# Patient Record
Sex: Female | Born: 1960
Health system: Southern US, Community
[De-identification: ages and names within clinical notes are randomized; demographics above are authoritative.]

## PROBLEM LIST (undated history)

## (undated) DIAGNOSIS — M8580 Other specified disorders of bone density and structure, unspecified site: Secondary | ICD-10-CM

## (undated) DIAGNOSIS — T7840XA Allergy, unspecified, initial encounter: Secondary | ICD-10-CM

## (undated) DIAGNOSIS — K219 Gastro-esophageal reflux disease without esophagitis: Secondary | ICD-10-CM

## (undated) HISTORY — PX: ERCP: SHX60

## (undated) HISTORY — PX: UPPER GASTROINTESTINAL ENDOSCOPY: SHX188

## (undated) HISTORY — DX: Gastro-esophageal reflux disease without esophagitis: K21.9

## (undated) HISTORY — PX: CHOLECYSTECTOMY: SHX55

## (undated) HISTORY — DX: Other specified disorders of bone density and structure, unspecified site: M85.80

## (undated) HISTORY — DX: Allergy, unspecified, initial encounter: T78.40XA

---

## 1998-06-10 ENCOUNTER — Ambulatory Visit (HOSPITAL_COMMUNITY): Admission: AD | Admit: 1998-06-10 | Discharge: 1998-06-10 | Payer: Self-pay | Admitting: *Deleted

## 1998-08-21 ENCOUNTER — Inpatient Hospital Stay (HOSPITAL_COMMUNITY): Admission: AD | Admit: 1998-08-21 | Discharge: 1998-08-26 | Payer: Self-pay | Admitting: Obstetrics and Gynecology

## 1998-10-14 ENCOUNTER — Observation Stay (HOSPITAL_COMMUNITY): Admission: RE | Admit: 1998-10-14 | Discharge: 1998-10-14 | Payer: Self-pay | Admitting: *Deleted

## 2002-07-26 ENCOUNTER — Other Ambulatory Visit: Admission: RE | Admit: 2002-07-26 | Discharge: 2002-07-26 | Payer: Self-pay | Admitting: Obstetrics and Gynecology

## 2004-08-24 ENCOUNTER — Ambulatory Visit: Payer: Self-pay | Admitting: Gastroenterology

## 2004-09-02 ENCOUNTER — Ambulatory Visit: Payer: Self-pay | Admitting: Gastroenterology

## 2004-09-24 ENCOUNTER — Emergency Department (HOSPITAL_COMMUNITY): Admission: AC | Admit: 2004-09-24 | Discharge: 2004-09-24 | Payer: Self-pay

## 2004-10-16 ENCOUNTER — Ambulatory Visit: Payer: Self-pay | Admitting: Gastroenterology

## 2004-10-22 ENCOUNTER — Ambulatory Visit: Payer: Self-pay | Admitting: Gastroenterology

## 2004-11-04 ENCOUNTER — Ambulatory Visit: Payer: Self-pay | Admitting: Gastroenterology

## 2005-06-11 ENCOUNTER — Other Ambulatory Visit: Admission: RE | Admit: 2005-06-11 | Discharge: 2005-06-11 | Payer: Self-pay | Admitting: Obstetrics and Gynecology

## 2006-06-08 ENCOUNTER — Ambulatory Visit: Payer: Self-pay | Admitting: Gastroenterology

## 2006-06-21 ENCOUNTER — Ambulatory Visit (HOSPITAL_COMMUNITY): Admission: RE | Admit: 2006-06-21 | Discharge: 2006-06-21 | Payer: Self-pay | Admitting: Gastroenterology

## 2006-06-29 ENCOUNTER — Ambulatory Visit: Payer: Self-pay | Admitting: Gastroenterology

## 2007-03-10 ENCOUNTER — Emergency Department (HOSPITAL_COMMUNITY): Admission: EM | Admit: 2007-03-10 | Discharge: 2007-03-10 | Payer: Self-pay | Admitting: Emergency Medicine

## 2007-03-11 ENCOUNTER — Ambulatory Visit (HOSPITAL_COMMUNITY): Admission: RE | Admit: 2007-03-11 | Discharge: 2007-03-11 | Payer: Self-pay | Admitting: Emergency Medicine

## 2009-04-03 ENCOUNTER — Telehealth: Payer: Self-pay | Admitting: Gastroenterology

## 2010-10-25 ENCOUNTER — Encounter: Payer: Self-pay | Admitting: Gastroenterology

## 2011-02-19 NOTE — Assessment & Plan Note (Signed)
[facility]                           GASTROENTEROLOGY OFFICE NOTE   NAME:Mcbee, SHIREE ALTEMUS                       MRN:          [id number]  DATE:06/08/2006                            DOB:          Mar 14, 1961    PROBLEM:  Abdominal pain.   Mrs. Goldner returned because of recurrent abdominal pain.  Last evening, she  developed severe unrelenting mid epigastric pain with some radiation to the  back.  She required narcotics for relief.  She was unable to find a  comfortable position.  Pain is similar to what she has had in the past.  In  the past, she has bumped her liver tests transiently.  Since last visit from  February 2006, she has had minor left upper quadrant discomfort which is  responsive to NuLev.  This pain clearly is different.  She is without fever.  She currently is fatigued but now pain-free.   MEDICATIONS:  1. Zoloft.  2. Clonazepam.   PHYSICAL EXAMINATION:  VITAL SIGNS:  Pulse 64, blood pressure 98/60, weight  143.  HEENT: EOMI. PERRLA. Sclerae are anicteric.  Conjunctivae are pink.  NECK:  Supple without thyromegaly, adenopathy or carotid bruits.  CHEST:  Clear to auscultation and percussion without adventitious sounds.  CARDIAC:  Regular rhythm; normal S1 S2.  There are no murmurs, gallops or  rubs.  ABDOMEN:  Bowel sounds are normoactive.  Abdomen is soft, non-tender and non-  distended.  There are no abdominal masses, tenderness, splenic enlargement  or hepatomegaly.  EXTREMITIES:  Full range of motion.  No cyanosis, clubbing or edema.  RECTAL:  Deferred.   IMPRESSION:  Recurrent, severe upper abdominal pain.  I am strongly  suspicious this is biliary tract pain, secondary to either  choledocholithiasis or perhaps biliary dyskinesia.   RECOMMENDATION:  1. Repeat LFTs.  2. ERCP.   I discussed the merits and risks for endoscopy sphincterotomy, particularly  in patients who may have biliary dyskinesia.  Risks for pancreatitis  with  occasional severe necrotizing pancreatitis  was discussed.  The patient is aware of this and wishes to proceed with the  procedure with possible sphincterotomy for biliary spasm if no stones are  seen.                                   Barbette Hair. Arlyce Dice, MD,FACG   RDK/MedQ  DD:  06/08/2006  DT:  06/08/2006  Job #:  [id number]   cc:   Leonette Most Record

## 2011-02-19 NOTE — Assessment & Plan Note (Signed)
[facility]                           GASTROENTEROLOGY OFFICE NOTE   NAME:Jasmine Wells, Jasmine Wells                         MRN:          [id number]  DATE:06/07/2006                            DOB:          1961/08/01    Her number is [phone].  [name] is a 50 year old patient of Dr. Arlyce Dice  who called tonight with severe upper abdominal pain under the right ribcage  radiating to the back that started about 30 minutes ago.  She was told by  Dr. Arlyce Dice to call if this pain occurs.  Apparently, there was a similar  episode approximately 2 years ago which raised the question of a common bile  duct stone.  She had a cholecystectomy in 2000.  She has not had any  episodes in the last two years until tonight.  She has no fever or jaundice.  She is in a lot of pain.   1. I advised patient to have a hepatitic function, amylase, lipase, in the      morning.  2. I called in to [facility], Vicodin generic dispensed 10, 1 p.o. every      3-4 hours p.r.n. abdominal pain.  3. Clear liquids tonight.  4. If the pain continues, call me and I will see her in the emergency      room.  5. I would leave a message for Dr. Marzetta Board nurse in the morning to call      the patient to set up appointment.                                   Hedwig Morton. Juanda Chance, MD   DMB/MedQ  DD:  06/07/2006  DT:  06/08/2006  Job #:  [id number]   cc:   Barbette Hair. Arlyce Dice, MD,FACG

## 2011-07-22 LAB — POCT CARDIAC MARKERS
CKMB, poc: <1 — ABNORMAL LOW
CKMB, poc: <1 — ABNORMAL LOW
Myoglobin, poc: 40.8
Myoglobin, poc: 46.8
Operator id: 192351
Operator id: 192351
Troponin i, poc: <0.05
Troponin i, poc: <0.05

## 2011-07-22 LAB — COMPREHENSIVE METABOLIC PANEL
ALT: 16
AST: 22
Albumin: 3.8
Alkaline Phosphatase: 56
BUN: 9
CO2: 24
Calcium: 9.5
Chloride: 102
Creatinine, Ser: 0.71
GFR calc Af Amer: >60
GFR calc non Af Amer: >60
Glucose, Bld: 97
Potassium: 4
Sodium: 137
Total Bilirubin: 0.5
Total Protein: 6.3

## 2011-07-22 LAB — DIFFERENTIAL
Basophils Absolute: 0
Basophils Relative: 1
Eosinophils Absolute: 0.1
Eosinophils Relative: 2
Lymphocytes Relative: 33
Lymphs Abs: 2.4
Monocytes Absolute: 0.5
Monocytes Relative: 6
Neutro Abs: 4.3
Neutrophils Relative %: 59

## 2011-07-22 LAB — CBC
HCT: 37.8
Hemoglobin: 12.7
MCHC: 33.7
MCV: 91.7
Platelets: 289
RBC: 4.12
RDW: 13.1
WBC: 7.4

## 2011-07-22 LAB — LIPASE, BLOOD: Lipase: 29

## 2011-07-22 LAB — D-DIMER, QUANTITATIVE: D-Dimer, Quant: <0.22

## 2012-05-05 ENCOUNTER — Encounter: Payer: Self-pay | Admitting: Gastroenterology

## 2012-05-05 DIAGNOSIS — J309 Allergic rhinitis, unspecified: Secondary | ICD-10-CM

## 2012-05-05 HISTORY — DX: Allergic rhinitis, unspecified: J30.9

## 2012-06-08 ENCOUNTER — Ambulatory Visit (AMBULATORY_SURGERY_CENTER): Payer: 59 | Admitting: *Deleted

## 2012-06-08 VITALS — Ht 64.0 in | Wt 154.1 lb

## 2012-06-08 DIAGNOSIS — Z1211 Encounter for screening for malignant neoplasm of colon: Secondary | ICD-10-CM

## 2012-06-08 MED ORDER — NA SULFATE-K SULFATE-MG SULF 17.5-3.13-1.6 GM/177ML PO SOLN: 1.0000 | Freq: Once | ORAL | Status: DC

## 2012-06-08 NOTE — Progress Notes (Signed)
No allergies to eggs or soy products 

## 2012-06-12 ENCOUNTER — Encounter: Payer: Self-pay | Admitting: Gastroenterology

## 2012-06-22 ENCOUNTER — Telehealth: Payer: Self-pay

## 2012-06-22 ENCOUNTER — Encounter: Payer: Self-pay | Admitting: Gastroenterology

## 2012-06-22 ENCOUNTER — Ambulatory Visit (AMBULATORY_SURGERY_CENTER): Payer: 59 | Admitting: Gastroenterology

## 2012-06-22 ENCOUNTER — Other Ambulatory Visit: Payer: Self-pay | Admitting: Gastroenterology

## 2012-06-22 VITALS — BP 127/73 | HR 79 | Temp 95.7°F | Resp 17 | Ht 64.0 in | Wt 154.0 lb

## 2012-06-22 DIAGNOSIS — Z1211 Encounter for screening for malignant neoplasm of colon: Secondary | ICD-10-CM

## 2012-06-22 DIAGNOSIS — K6389 Other specified diseases of intestine: Secondary | ICD-10-CM

## 2012-06-22 MED ORDER — SODIUM CHLORIDE 0.9 % IV SOLN: 500.0000 mL | INTRAVENOUS | Status: DC

## 2012-06-22 NOTE — Progress Notes (Signed)
Patient did not experience any of the following events: a burn prior to discharge; a fall within the facility; wrong site/side/patient/procedure/implant event; or a hospital transfer or hospital admission upon discharge from the facility. (G8907) Patient did not have preoperative order for IV antibiotic SSI prophylaxis. (G8918)  

## 2012-06-22 NOTE — Op Note (Signed)
[facility] [address] [place], [address]   COLONOSCOPY PROCEDURE REPORT  PATIENT: Jasmine Wells, Jasmine Wells  MR#: [id number] BIRTHDATE: 07-28-1961 , 51  yrs. old GENDER: Female ENDOSCOPIST: Louis Meckel, MD REFERRED LK:[id number] Record, M.D. PROCEDURE DATE:  06/22/2012 PROCEDURE:   Colonoscopy, diagnostic ASA CLASS:   Class I INDICATIONS:average risk screening. MEDICATIONS: MAC sedation, administered by CRNA and propofol (Diprivan) 250mg  IV  DESCRIPTION OF PROCEDURE:   After the risks benefits and alternatives of the procedure were thoroughly explained, informed consent was obtained.  A digital rectal exam revealed no abnormalities of the rectum.   The LB CF-[id number]  endoscope was introduced through the anus and advanced to the cecum, which was identified by both the appendix and ileocecal valve. No adverse events experienced.   The quality of the prep was Suprep fair  The instrument was then slowly withdrawn as the colon was fully examined.      COLON FINDINGS: Submucosal area of prominence (?mass) in area of appendiceal orifice measuring 2cm.   The colon mucosa was otherwise normal.  Retroflexed views revealed no abnormalities. The time to cecum=3 minutes 27 seconds.  Withdrawal time=6 minutes 07 seconds. The scope was withdrawn and the procedure completed. COMPLICATIONS: There were no complications.  ENDOSCOPIC IMPRESSION: 1.   Submucosal area of prominence (?mass) in area of appendiceal orifice measuring 2cm. 2.   The colon mucosa was otherwise normal  RECOMMENDATIONS: 1.  My office will arrange for you to have a CT scan of abdomen and pelvis. 2.  Continue current colorectal screening recommendations for "routine risk" patients with a repeat colonoscopy in 10 years.   eSigned:  Louis Meckel, MD 06/22/2012 10:38 AM   cc:

## 2012-06-22 NOTE — Progress Notes (Signed)
Please contact pt re. CT scan at this number: [phone] (pt's cell )

## 2012-06-22 NOTE — Telephone Encounter (Signed)
Pt scheduled for CT scan of abdomen and pelvis at [facility] 06/27/12 arrival time 9am. Pt to be NPO after midnight, drink 1st bottle of contrast at 7am and the 2nd bottle of contrast at 8am. Pt aware of appt date and time.

## 2012-06-22 NOTE — Patient Instructions (Addendum)
YOU HAD AN ENDOSCOPIC PROCEDURE TODAY AT THE [facility]: Refer to the procedure report that was given to you for any specific questions about what was found during the examination.  If the procedure report does not answer your questions, please call your gastroenterologist to clarify.  If you requested that your care partner not be given the details of your procedure findings, then the procedure report has been included in a sealed envelope for you to review at your convenience later.  YOU SHOULD EXPECT: Some feelings of bloating in the abdomen. Passage of more gas than usual.  Walking can help get rid of the air that was put into your GI tract during the procedure and reduce the bloating. If you had a lower endoscopy (such as a colonoscopy or flexible sigmoidoscopy) you may notice spotting of blood in your stool or on the toilet paper. If you underwent a bowel prep for your procedure, then you may not have a normal bowel movement for a few days.  DIET: Your first meal following the procedure should be a light meal and then it is ok to progress to your normal diet.  A half-sandwich or bowl of soup is an example of a good first meal.  Heavy or fried foods are harder to digest and may make you feel nauseous or bloated.  Likewise meals heavy in dairy and vegetables can cause extra gas to form and this can also increase the bloating.  Drink plenty of fluids but you should avoid alcoholic beverages for 24 hours.  ACTIVITY: Your care partner should take you home directly after the procedure.  You should plan to take it easy, moving slowly for the rest of the day.  You can resume normal activity the day after the procedure however you should NOT DRIVE or use heavy machinery for 24 hours (because of the sedation medicines used during the test).    SYMPTOMS TO REPORT IMMEDIATELY: A gastroenterologist can be reached at any hour.  During normal business hours, 8:30 AM to 5:00 PM Monday through Friday,  call [phone].  After hours and on weekends, please call the GI answering service at [phone] who will take a message and have the physician on call contact you.   Following lower endoscopy (colonoscopy or flexible sigmoidoscopy):  Excessive amounts of blood in the stool  Significant tenderness or worsening of abdominal pains  Swelling of the abdomen that is new, acute  Fever of 100F or higher  Following upper endoscopy (EGD)  Vomiting of blood or coffee ground material  New chest pain or pain under the shoulder blades  Painful or persistently difficult swallowing  New shortness of breath  Fever of 100F or higher  Black, tarry-looking stools  FOLLOW UP: If any biopsies were taken you will be contacted by phone or by letter within the next 1-3 weeks.  Call your gastroenterologist if you have not heard about the biopsies in 3 weeks.  Our staff will call the home number listed on your records the next business day following your procedure to check on you and address any questions or concerns that you may have at that time regarding the information given to you following your procedure. This is a courtesy call and so if there is no answer at the home number and we have not heard from you through the emergency physician on call, we will assume that you have returned to your regular daily activities without incident.  SIGNATURES/CONFIDENTIALITY: You and/or your care   partner have signed paperwork which will be entered into your electronic medical record.  These signatures attest to the fact that that the information above on your After Visit Summary has been reviewed and is understood.  Full responsibility of the confidentiality of this discharge information lies with you and/or your care-partner.  

## 2012-06-23 ENCOUNTER — Telehealth: Payer: Self-pay | Admitting: *Deleted

## 2012-06-23 NOTE — Telephone Encounter (Signed)
  Follow up Call-  Call back number 06/22/2012  Post procedure Call Back phone  # [phone]  Permission to leave phone message Yes     Patient questions:  Do you have a fever, pain , or abdominal swelling? no Pain Score  0 *  Have you tolerated food without any problems? yes  Have you been able to return to your normal activities? yes  Do you have any questions about your discharge instructions: Diet   no Medications  no Follow up visit  no  Do you have questions or concerns about your Care? no  Actions: * If pain score is 4 or above: No action needed, pain <4.

## 2012-06-27 ENCOUNTER — Ambulatory Visit (INDEPENDENT_AMBULATORY_CARE_PROVIDER_SITE_OTHER)
Admission: RE | Admit: 2012-06-27 | Discharge: 2012-06-27 | Disposition: A | Discharge status: Home / Self Care | Payer: 59 | Source: Ambulatory Visit | Attending: Gastroenterology | Admitting: Gastroenterology

## 2012-06-27 DIAGNOSIS — K6389 Other specified diseases of intestine: Secondary | ICD-10-CM

## 2012-06-27 MED ORDER — IOHEXOL 300 MG/ML  SOLN
100.0000 mL | Freq: Once | INTRAMUSCULAR | Status: AC | PRN
  Administered 2012-06-27: 100 mL via INTRAVENOUS

## 2012-06-28 ENCOUNTER — Telehealth: Payer: Self-pay | Admitting: Gastroenterology

## 2012-06-28 NOTE — Telephone Encounter (Signed)
Pt is calling for results of CT scan. Dr. Arlyce Dice please advise.

## 2012-06-29 NOTE — Telephone Encounter (Signed)
Discussed results with pt and copy of scan sent to PCP.

## 2012-06-29 NOTE — Telephone Encounter (Signed)
CT looks okay. There is nothing specific in the area of the colon that was in question. No followup is required.

## 2013-07-15 ENCOUNTER — Emergency Department (HOSPITAL_BASED_OUTPATIENT_CLINIC_OR_DEPARTMENT_OTHER)
Admission: EM | Admit: 2013-07-15 | Discharge: 2013-07-15 | Disposition: A | Discharge status: Home / Self Care | Payer: 59 | Attending: Emergency Medicine | Admitting: Emergency Medicine

## 2013-07-15 ENCOUNTER — Encounter (HOSPITAL_BASED_OUTPATIENT_CLINIC_OR_DEPARTMENT_OTHER): Payer: Self-pay | Admitting: Emergency Medicine

## 2013-07-15 ENCOUNTER — Emergency Department (HOSPITAL_BASED_OUTPATIENT_CLINIC_OR_DEPARTMENT_OTHER): Payer: 59

## 2013-07-15 DIAGNOSIS — Y929 Unspecified place or not applicable: Secondary | ICD-10-CM | POA: Insufficient documentation

## 2013-07-15 DIAGNOSIS — W108XXA Fall (on) (from) other stairs and steps, initial encounter: Secondary | ICD-10-CM | POA: Insufficient documentation

## 2013-07-15 DIAGNOSIS — M79672 Pain in left foot: Secondary | ICD-10-CM

## 2013-07-15 DIAGNOSIS — Z8739 Personal history of other diseases of the musculoskeletal system and connective tissue: Secondary | ICD-10-CM | POA: Insufficient documentation

## 2013-07-15 DIAGNOSIS — W010XXA Fall on same level from slipping, tripping and stumbling without subsequent striking against object, initial encounter: Secondary | ICD-10-CM | POA: Insufficient documentation

## 2013-07-15 DIAGNOSIS — Y9301 Activity, walking, marching and hiking: Secondary | ICD-10-CM | POA: Insufficient documentation

## 2013-07-15 DIAGNOSIS — S93401A Sprain of unspecified ligament of right ankle, initial encounter: Secondary | ICD-10-CM

## 2013-07-15 DIAGNOSIS — S93409A Sprain of unspecified ligament of unspecified ankle, initial encounter: Secondary | ICD-10-CM | POA: Insufficient documentation

## 2013-07-15 DIAGNOSIS — Z8719 Personal history of other diseases of the digestive system: Secondary | ICD-10-CM | POA: Insufficient documentation

## 2013-07-15 NOTE — ED Notes (Signed)
Patient here with fall down basement steps yesterday. Swelling to bilateral feet and pain to right ankle as well, pain with any ambulation

## 2013-07-15 NOTE — ED Provider Notes (Signed)
Medical screening examination/treatment/procedure(s) were performed by non-physician practitioner and as supervising physician I was immediately available for consultation/collaboration.   William Jovie Swanner, MD 07/15/13 1512 

## 2013-07-15 NOTE — ED Provider Notes (Signed)
CSN: [id number]     Arrival date & time 07/15/13  1344 History   First MD Initiated Contact with Patient 07/15/13 1353     Chief Complaint  Patient presents with  . Fall   (Consider location/radiation/quality/duration/timing/severity/associated sxs/prior Treatment) Patient is a 52 y.o. female presenting with fall. The history is provided by the patient. No language interpreter was used.  Fall This is a new problem. The current episode started yesterday. The problem occurs constantly. Pertinent negatives include no abdominal pain, chills, fever, neck pain, numbness or weakness. Associated symptoms comments: She slipped while walking down a set of wooden steps, landing on her feet after missing 1-2 steps. She complains of pain in the right ankle and distal foot and left distal foot. She has been using crutches to ambulate but came for evaluation when the pain was no better today. No other injury..    Past Medical History  Diagnosis Date  . Allergy   . GERD (gastroesophageal reflux disease)   . Osteopenia    Past Surgical History  Procedure Laterality Date  . Upper gastrointestinal endoscopy    . Ercp    . Cholecystectomy    . Cesarean section     Family History  Problem Relation Age of Onset  . Colon cancer Maternal Grandfather   . Esophageal cancer Neg Hx   . Rectal cancer Neg Hx   . Stomach cancer Neg Hx    History  Substance Use Topics  . Smoking status: Never Smoker   . Smokeless tobacco: Never Used  . Alcohol Use: No   OB History   Grav Para Term Preterm Abortions TAB SAB Ect Mult Living                 Review of Systems  Constitutional: Negative for fever and chills.  Gastrointestinal: Negative.  Negative for abdominal pain.  Musculoskeletal: Negative for back pain and neck pain.       See HPI.  Skin: Negative.  Negative for wound.  Neurological: Negative.  Negative for weakness and numbness.    Allergies  Erythromycin; Reglan; and Sulfa antibiotics  Home  Medications   Current Outpatient Rx  Name  Route  Sig  Dispense  Refill  . clonazePAM (KLONOPIN) 1 MG tablet   Oral   Take 1 mg by mouth daily.         . IBUPROFEN PO   Oral   Take by mouth as needed.         . venlafaxine XR (EFFEXOR-XR) 150 MG 24 hr capsule   Oral   Take 150 mg by mouth daily.          BP 125/75  Pulse 83  Temp(Src) 98.2 F (36.8 C)  Resp 18  SpO2 100% Physical Exam  Constitutional: She is oriented to person, place, and time. She appears well-developed and well-nourished.  Neck: Normal range of motion.  Pulmonary/Chest: Effort normal.  Musculoskeletal:  Right ankle moderately swollen laterally without bony deformity or discoloration. Joint stable. Tender to palpation over lateral malleolus. Right foot moderately tender overlying distal 2nd and 3rd MT. No swelling or discoloration of the foot. Left ankle nontender and stable. Left foot significantly tender overlying dorsal 3rd MT. No bony deformity or significant discoloration.  Neurological: She is alert and oriented to person, place, and time.  Skin: Skin is warm and dry.    ED Course  Procedures (including critical care time) Labs Review Labs Reviewed - No data to display Imaging Review Dg  Ankle Complete Right  07/15/2013   CLINICAL DATA:  Patient slipped yesterday complaining of right ankle right foot pain.  EXAM: RIGHT ANKLE - COMPLETE 3+ VIEW  COMPARISON:  None.  FINDINGS: Tiny sliver of bone lies along the inferior margin of the distal fibula which may reflect a recent avulsion fracture or be chronic.  No other evidence of a fracture. The ankle mortise is normally space and aligned. There is diffuse soft tissue swelling.  IMPRESSION: 1. Evidence of a minimal avulsion fracture from the inferior margin of the distal fibula. This may be recent or chronic. 2. No other evidence of a fracture. Ankle joint normally space and aligned.   Electronically Signed   By: Amie Portland M.D.   On: 07/15/2013  14:40   Dg Foot Complete Left  07/15/2013   CLINICAL DATA:  Patient slipped yesterday. Complaining of left foot pain.  EXAM: LEFT FOOT - COMPLETE 3+ VIEW  COMPARISON:  None.  FINDINGS: There is no evidence of fracture or dislocation. There is no evidence of arthropathy or other focal bone abnormality. Soft tissues are unremarkable.  IMPRESSION: Negative.   Electronically Signed   By: Amie Portland M.D.   On: 07/15/2013 14:41   Dg Foot Complete Right  07/15/2013   CLINICAL DATA:  Patient slipped yesterday. Complaining of right foot pain.  EXAM: RIGHT FOOT COMPLETE - 3+ VIEW  COMPARISON:  None.  FINDINGS: No foot fracture. The joints are normally space and aligned.  On the AP view, there is a small avulsion fracture from the tip of the distal fibula that appears acute. There is associated soft tissue swelling.  IMPRESSION: Small avulsion fracture from the tip of the distal fibula. No other fracture. No dislocation.   Electronically Signed   By: Amie Portland M.D.   On: 07/15/2013 14:43    EKG Interpretation   None       MDM  No diagnosis found. 1. Right ankle sprain 2. Left foot strain  X-rays support no acute fracture. Supportive care. Patient declines pain medication.     Arnoldo Hooker, PA-C 07/15/13 1500

## 2014-12-06 IMAGING — CR DG FOOT COMPLETE 3+V*R*
3 series · 3 of 3 positions shown · non-contrast
Comparison: None.

CLINICAL DATA: Patient slipped yesterday. Complaining of right foot
pain.

EXAM:
RIGHT FOOT COMPLETE - 3+ VIEW

[t foot lat right]
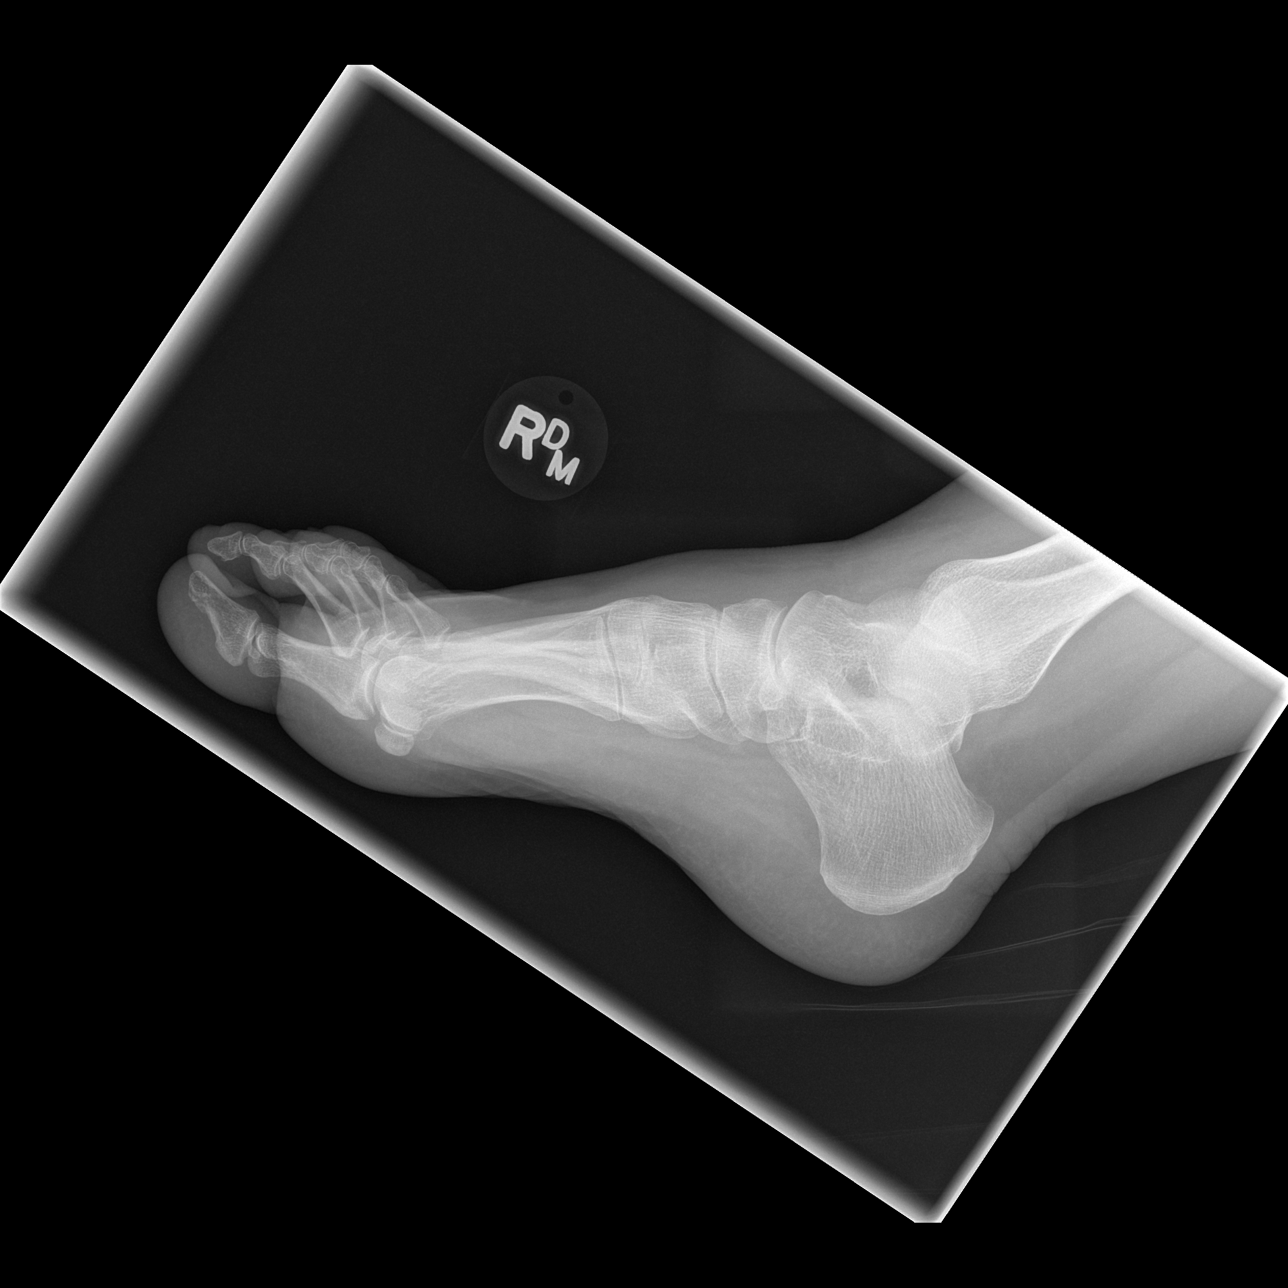

[t foot ap right]
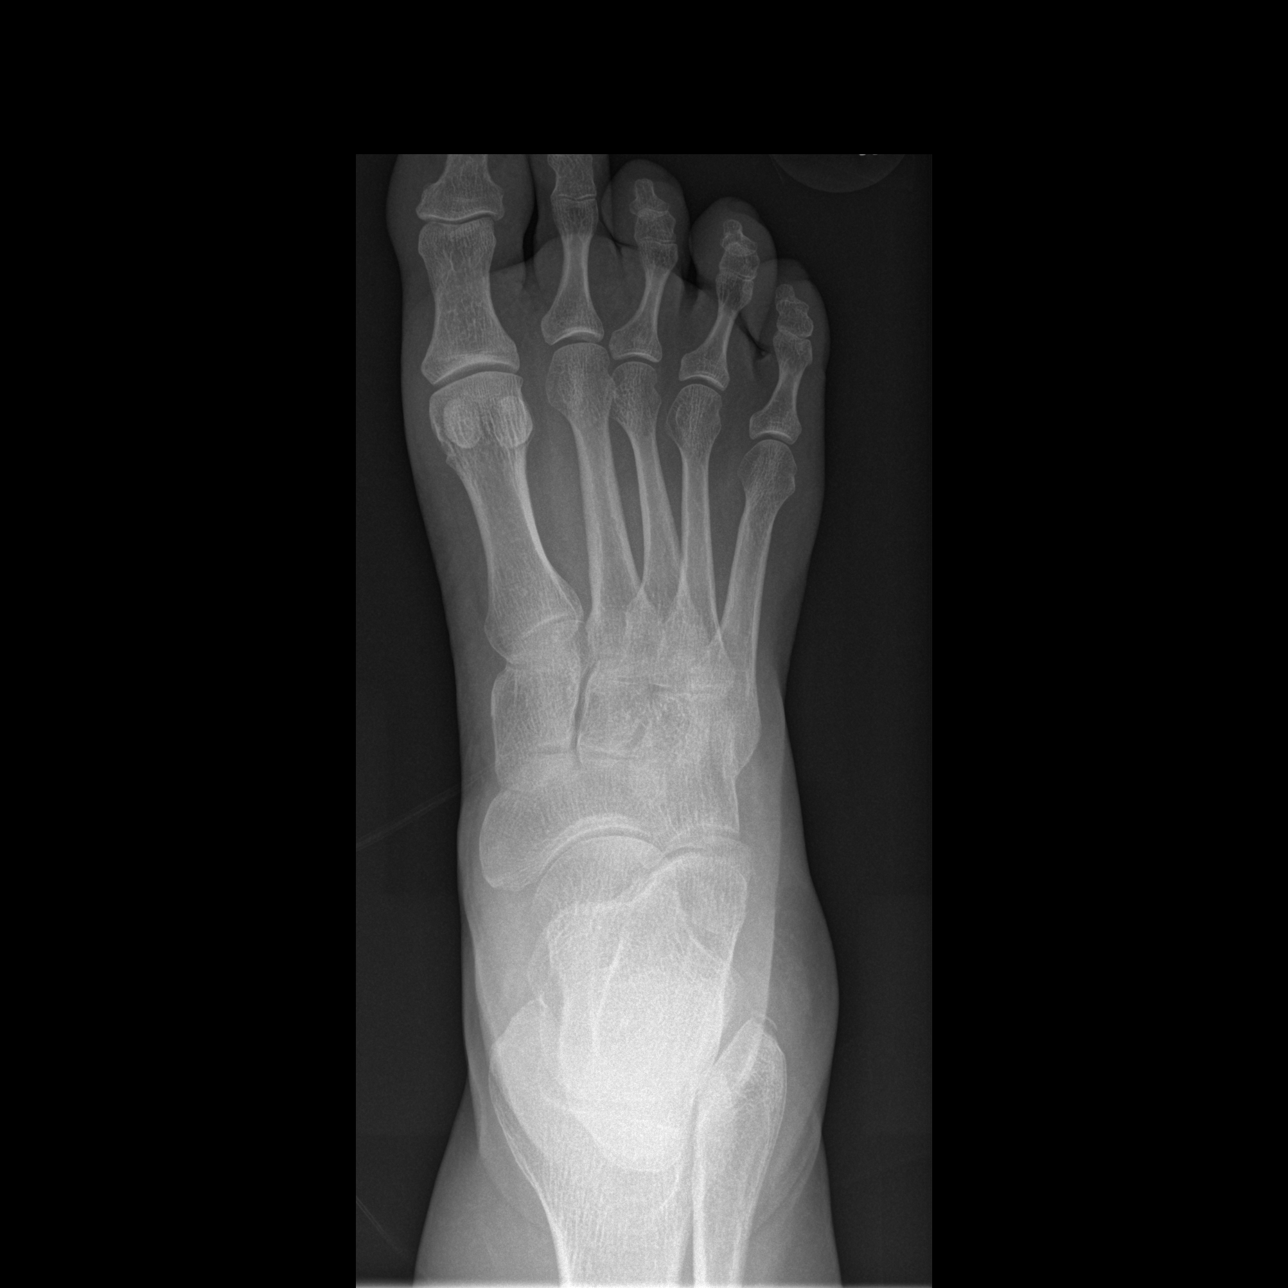

[t foot oblique right]
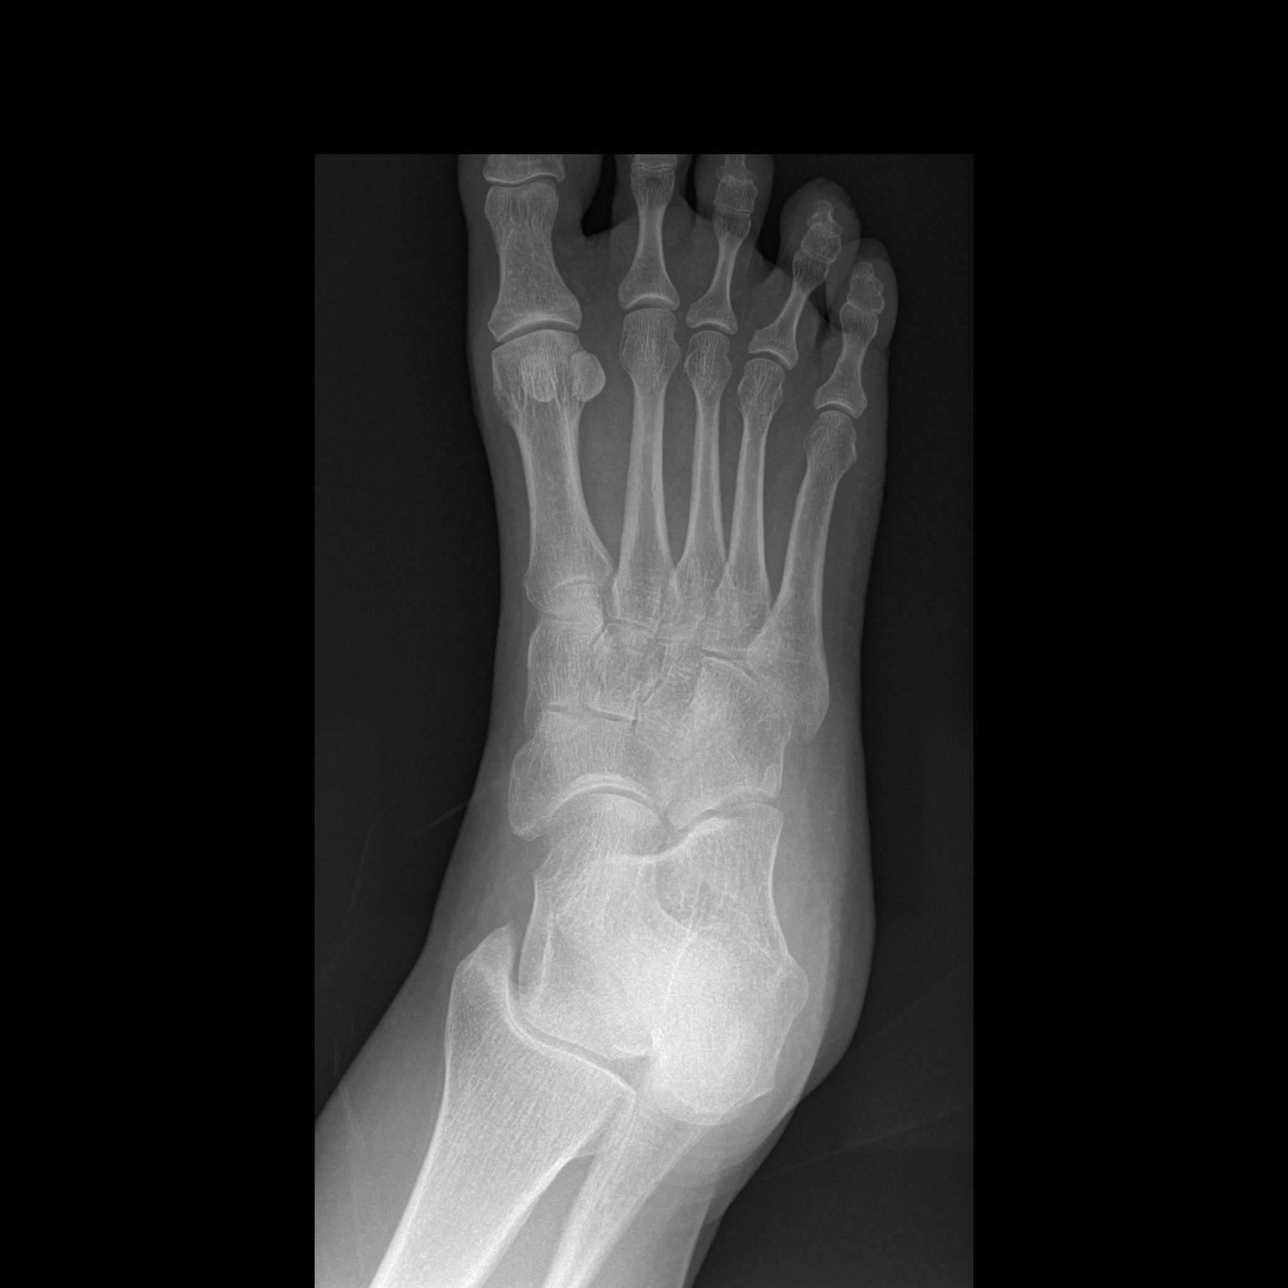

[3 of 3 positions shown; findings below may reference images not displayed]

FINDINGS: No foot fracture. The joints are normally space and aligned.

On the AP view, there is a small avulsion fracture from the tip of
the distal fibula that appears acute. There is associated soft
tissue swelling.
IMPRESSION: Small avulsion fracture from the tip of the distal fibula. No other
fracture. No dislocation.

## 2014-12-06 IMAGING — CR DG ANKLE COMPLETE 3+V*R*
3 series · 3 of 3 positions shown · non-contrast
Comparison: None.

CLINICAL DATA: Patient slipped yesterday complaining of right ankle
right foot pain.

EXAM:
RIGHT ANKLE - COMPLETE 3+ VIEW

[t ankle joint ap right]
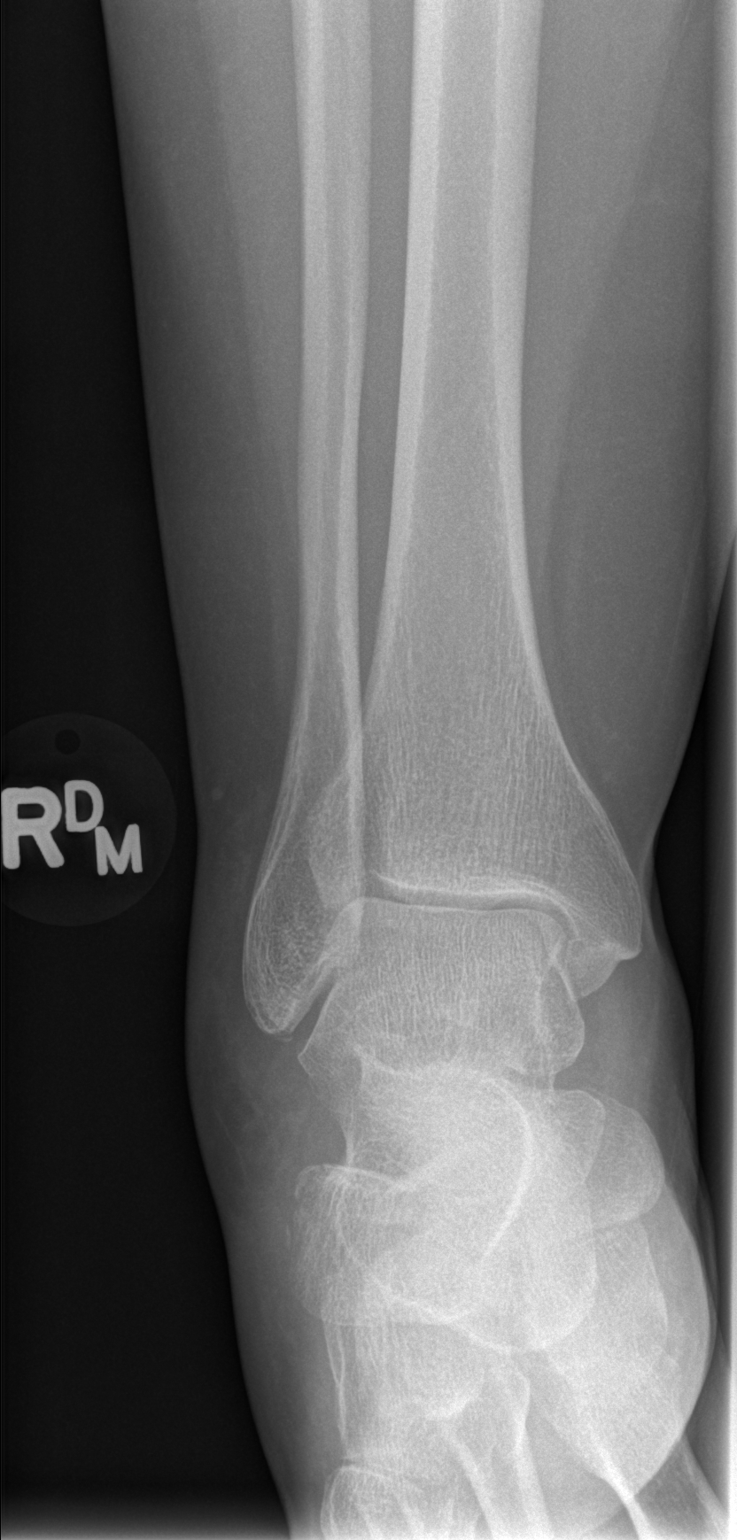

[t ankle joint oblique right]
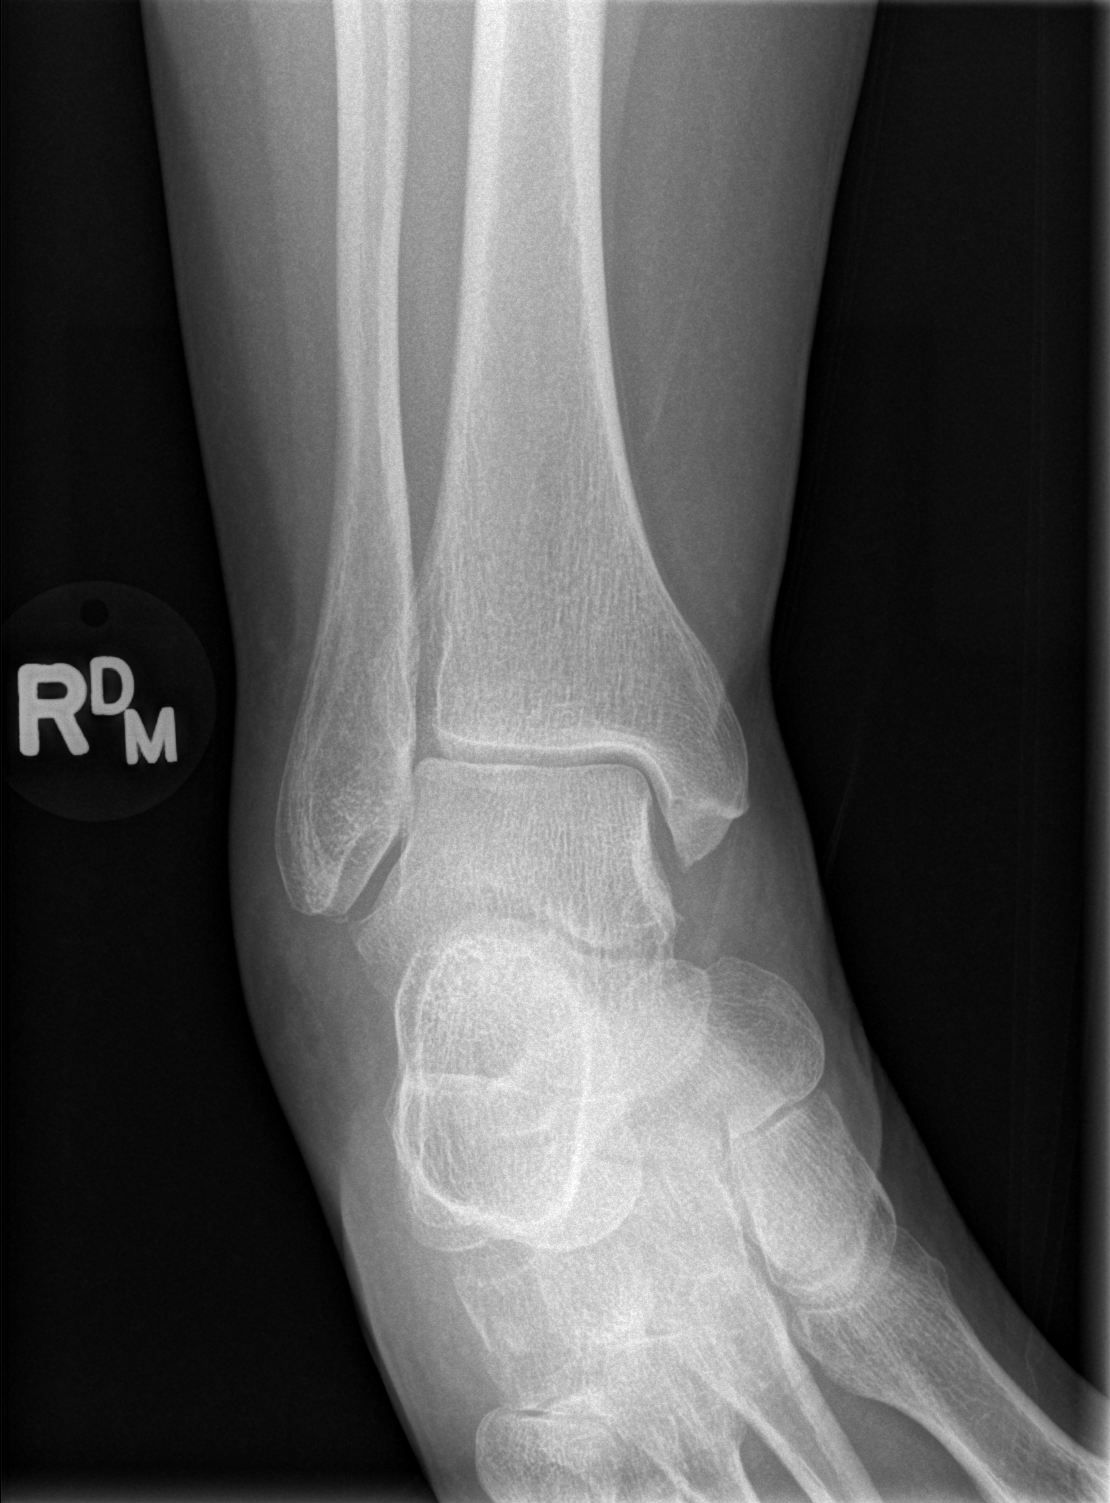

[t ankle joint lat right]
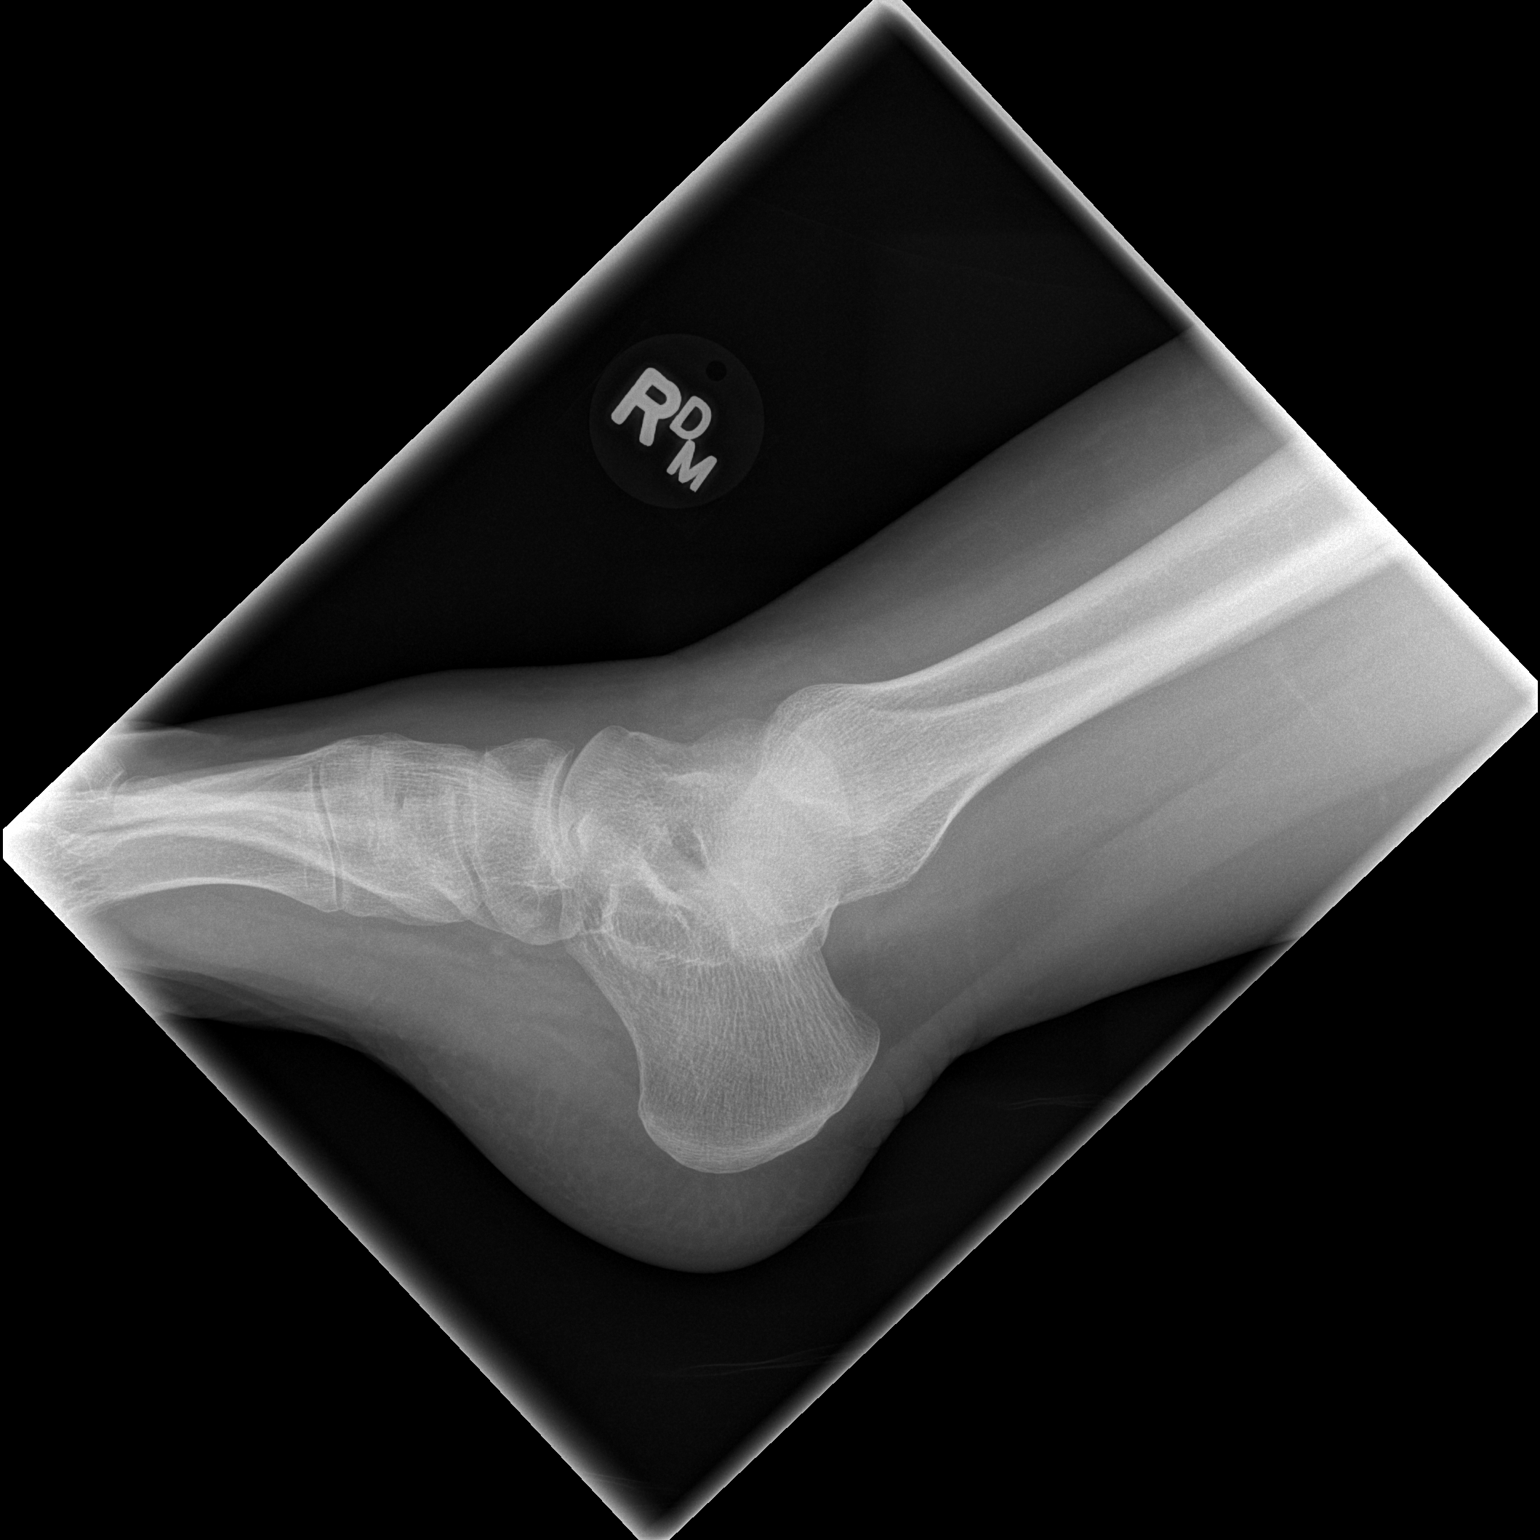

[3 of 3 positions shown; findings below may reference images not displayed]

FINDINGS: Tiny sliver of bone lies along the inferior margin of the distal
fibula which may reflect a recent avulsion fracture or be chronic.

No other evidence of a fracture. The ankle mortise is normally space
and aligned. There is diffuse soft tissue swelling.
IMPRESSION: 1. Evidence of a minimal avulsion fracture from the inferior margin
of the distal fibula. This may be recent or chronic.
2. No other evidence of a fracture. Ankle joint normally space and
aligned.

## 2015-03-19 ENCOUNTER — Encounter: Payer: Self-pay | Admitting: Gastroenterology

## 2017-06-20 DIAGNOSIS — K219 Gastro-esophageal reflux disease without esophagitis: Secondary | ICD-10-CM | POA: Insufficient documentation

## 2017-08-03 ENCOUNTER — Emergency Department (HOSPITAL_BASED_OUTPATIENT_CLINIC_OR_DEPARTMENT_OTHER)
Admission: EM | Admit: 2017-08-03 | Discharge: 2017-08-03 | Disposition: A | Discharge status: Home / Self Care | Payer: 59 | Attending: Emergency Medicine | Admitting: Emergency Medicine

## 2017-08-03 ENCOUNTER — Encounter (HOSPITAL_BASED_OUTPATIENT_CLINIC_OR_DEPARTMENT_OTHER): Payer: Self-pay

## 2017-08-03 DIAGNOSIS — Z79899 Other long term (current) drug therapy: Secondary | ICD-10-CM | POA: Insufficient documentation

## 2017-08-03 DIAGNOSIS — I10 Essential (primary) hypertension: Secondary | ICD-10-CM | POA: Diagnosis not present

## 2017-08-03 DIAGNOSIS — R42 Dizziness and giddiness: Secondary | ICD-10-CM | POA: Diagnosis present

## 2017-08-03 LAB — BASIC METABOLIC PANEL
ANION GAP: 6 (ref 5–15)
BUN: 16 mg/dL (ref 6–20)
CO2: 28 mmol/L (ref 22–32)
Calcium: 9.5 mg/dL (ref 8.9–10.3)
Chloride: 104 mmol/L (ref 101–111)
Creatinine, Ser: 0.77 mg/dL (ref 0.44–1.00)
GFR calc Af Amer: >60 mL/min (ref >60)
GFR calc non Af Amer: >60 mL/min (ref >60)
Glucose, Bld: 101 mg/dL — ABNORMAL HIGH (ref 65–99)
Potassium: 4.3 mmol/L (ref 3.5–5.1)
Sodium: 138 mmol/L (ref 135–145)

## 2017-08-03 LAB — CBC WITH DIFFERENTIAL/PLATELET
BASOS PCT: 1 %
Basophils Absolute: 0.1 10*3/uL (ref 0.0–0.1)
Eosinophils Absolute: 0.2 10*3/uL (ref 0.0–0.7)
Eosinophils Relative: 2 %
HEMATOCRIT: 39.8 % (ref 36.0–46.0)
Hemoglobin: 13.6 g/dL (ref 12.0–15.0)
Lymphocytes Relative: 33 %
Lymphs Abs: 2.6 10*3/uL (ref 0.7–4.0)
MCH: 30.6 pg (ref 26.0–34.0)
MCHC: 34.2 g/dL (ref 30.0–36.0)
MCV: 89.4 fL (ref 78.0–100.0)
MONOS PCT: 8 %
Monocytes Absolute: 0.6 10*3/uL (ref 0.1–1.0)
NEUTROS ABS: 4.3 10*3/uL (ref 1.7–7.7)
Neutrophils Relative %: 56 %
Platelets: 312 10*3/uL (ref 150–400)
RBC: 4.45 MIL/uL (ref 3.87–5.11)
RDW: 12.1 % (ref 11.5–15.5)
WBC: 7.7 10*3/uL (ref 4.0–10.5)

## 2017-08-03 MED ORDER — HYDROCHLOROTHIAZIDE 25 MG PO TABS: 25.0000 mg | ORAL_TABLET | Freq: Every day | ORAL | 0 refills | Status: DC

## 2017-08-03 MED ORDER — HYDROCHLOROTHIAZIDE 25 MG PO TABS
25.0000 mg | ORAL_TABLET | Freq: Once | ORAL | Status: AC
  Administered 2017-08-03: 25 mg via ORAL
  Filled 2017-08-03: qty 1

## 2017-08-03 MED FILL — HYDROCHLOROTHIAZIDE 25 MG T: 25 | 30 days supply | Qty: 30 | Fill #0

## 2017-08-03 NOTE — ED Notes (Signed)
ED Provider at bedside. 

## 2017-08-03 NOTE — ED Triage Notes (Signed)
Pt states her BP has been elevated x 3 weeks-no hx of HTN-called PCP and was advised to come to ED-NAD-steady gait

## 2017-08-03 NOTE — ED Provider Notes (Signed)
[facility] EMERGENCY DEPARTMENT Provider Note   CSN: [id number] Arrival date & time: 08/03/17  1531     History   Chief Complaint Chief Complaint  Patient presents with  . Hypertension    HPI Jasmine Wells is a 56 y.o. female.  HPI  56 year old female presents with a chief complaint of hypertension.  About 3 weeks ago she had an EGD and while she was there they noted her blood pressure to be hypertensive.  She states before that she has never had hypertension despite multiple visits to her PCP.  Since then she has been checking her blood pressure almost daily and it is typically in the 150s over 90s.  However today it was 160s over low 100s.  Over the last couple days she has had some mild dizziness when she first stands up that seems to go away as well as a mild headache of 3 out of 10.  No chest pain, shortness of breath, blurry vision, or focal weakness/numbness.  Called her PCP but her PCP is not going to be in the office until 2 days from now so she was encouraged to come to the ER due to the degree of hypertension.  Past Medical History:  Diagnosis Date  . Allergy   . GERD (gastroesophageal reflux disease)   . Osteopenia     There are no active problems to display for this patient.   Past Surgical History:  Procedure Laterality Date  . CESAREAN SECTION    . CHOLECYSTECTOMY    . ERCP    . UPPER GASTROINTESTINAL ENDOSCOPY      OB History    No data available       Home Medications    Prior to Admission medications   Medication Sig Start Date End Date Taking? Authorizing Provider  OMEPRAZOLE PO Take by mouth.   Yes [provider]  clonazePAM (KLONOPIN) 1 MG tablet Take 1 mg by mouth daily.    [provider]  hydrochlorothiazide (HYDRODIURIL) 25 MG tablet Take 1 tablet (25 mg total) by mouth daily. 08/03/17   Pricilla LovelessGoldston, Sumaiyah Markert, MD  IBUPROFEN PO Take by mouth as needed.    [provider]  venlafaxine XR (EFFEXOR-XR) 150  MG 24 hr capsule Take 150 mg by mouth daily.    [provider]    Family History Family History  Problem Relation Age of Onset  . Colon cancer Maternal Grandfather   . Esophageal cancer Neg Hx   . Rectal cancer Neg Hx   . Stomach cancer Neg Hx     Social History Social History  Substance Use Topics  . Smoking status: Never Smoker  . Smokeless tobacco: Never Used  . Alcohol use No     Allergies   Erythromycin; Reglan [metoclopramide]; and Sulfa antibiotics   Review of Systems Review of Systems  Eyes: Negative for visual disturbance.  Respiratory: Negative for shortness of breath.   Cardiovascular: Negative for chest pain.  Gastrointestinal: Negative for abdominal pain and vomiting.  Neurological: Positive for dizziness and headaches. Negative for weakness and numbness.  All other systems reviewed and are negative.    Physical Exam Updated Vital Signs BP (!) 148/89 (BP Location: Right Arm)   Pulse 69   Temp 98.1 F (36.7 C) (Oral)   Resp 18   Ht 5\' 4"  (1.626 m)   Wt 75.7 kg (166 lb 14.2 oz)   SpO2 100%   BMI 28.65 kg/m   Physical Exam  Constitutional: She  is oriented to person, place, and time. She appears well-developed and well-nourished.  HENT:  Head: Normocephalic and atraumatic.  Right Ear: External ear normal.  Left Ear: External ear normal.  Nose: Nose normal.  Eyes: Pupils are equal, round, and reactive to light. EOM are normal. Right eye exhibits no discharge. Left eye exhibits no discharge.  Neck: Neck supple.  Cardiovascular: Normal rate, regular rhythm and normal heart sounds.   Pulmonary/Chest: Effort normal and breath sounds normal.  Abdominal: Soft. There is no tenderness.  Neurological: She is alert and oriented to person, place, and time.  CN 3-12 grossly intact. 5/5 strength in all 4 extremities. Grossly normal sensation. Normal finger to nose.   Skin: Skin is warm and dry.  Nursing note and vitals reviewed.    ED  Treatments / Results  Labs (all labs ordered are listed, but only abnormal results are displayed) Labs Reviewed  BASIC METABOLIC PANEL - Abnormal; Notable for the following:       Result Value   Glucose, Bld 101 (*)    All other components within normal limits  CBC WITH DIFFERENTIAL/PLATELET    EKG  EKG Interpretation None       Radiology No results found.  Procedures Procedures (including critical care time)  Medications Ordered in ED Medications  hydrochlorothiazide (HYDRODIURIL) tablet 25 mg (not administered)     Initial Impression / Assessment and Plan / ED Course  I have reviewed the triage vital signs and the nursing notes.  Pertinent labs & imaging results that were available during my care of the patient were reviewed by me and considered in my medical decision making (see chart for details).     Patient's blood pressure has mildly down trended without any intervention.  She does not have any signs or symptoms of hypertensive emergency.  Will discharge with hydrochlorothiazide and follow-up with PCP which she is Artie scheduling for 2 days from now.  Otherwise discussed return precautions.  Final Clinical Impressions(s) / ED Diagnoses   Final diagnoses:  Essential hypertension    New Prescriptions New Prescriptions   HYDROCHLOROTHIAZIDE (HYDRODIURIL) 25 MG TABLET    Take 1 tablet (25 mg total) by mouth daily.     Pricilla Loveless, MD 08/03/17 [phone]

## 2017-12-28 DIAGNOSIS — R0609 Other forms of dyspnea: Secondary | ICD-10-CM | POA: Insufficient documentation

## 2017-12-28 DIAGNOSIS — I10 Essential (primary) hypertension: Secondary | ICD-10-CM

## 2017-12-28 HISTORY — DX: Other forms of dyspnea: R06.09

## 2017-12-28 HISTORY — DX: Essential (primary) hypertension: I10

## 2020-03-05 ENCOUNTER — Ambulatory Visit: Payer: 59 | Admitting: Family Medicine

## 2020-04-02 ENCOUNTER — Other Ambulatory Visit: Payer: Self-pay

## 2020-04-02 ENCOUNTER — Encounter: Payer: Self-pay | Admitting: Family Medicine

## 2020-04-02 ENCOUNTER — Ambulatory Visit: Payer: 59 | Admitting: Family Medicine

## 2020-04-02 VITALS — BP 108/70 | HR 84 | Temp 97.0°F | Ht 64.0 in | Wt 162.2 lb

## 2020-04-02 DIAGNOSIS — R0609 Other forms of dyspnea: Secondary | ICD-10-CM

## 2020-04-02 DIAGNOSIS — R5383 Other fatigue: Secondary | ICD-10-CM

## 2020-04-02 DIAGNOSIS — R0600 Dyspnea, unspecified: Secondary | ICD-10-CM | POA: Diagnosis not present

## 2020-04-02 DIAGNOSIS — G4700 Insomnia, unspecified: Secondary | ICD-10-CM

## 2020-04-02 NOTE — Progress Notes (Signed)
Chief Complaint  Patient presents with   New Patient (Initial Visit)   Fatigue   Shortness of Breath       New Patient Visit SUBJECTIVE: HPI: Jasmine Wells is an 59 y.o.female who is being seen for establishing care.  The patient has not had PCP, followed with GYN.   6-7 mo ago started having DOE and fatigue with exertion. Possible wheezing. Former smoker. No famhx of CHF. +famhx of low thyroid levels. Does not exercise. Diet is "pretty good". Sleeps ~5 hrs nightly. Snores, does not wake up gasping for air. Mood is fine, does have lots of stress between raising her grandchild and work. Denies fevers, unexplained wt changes, palpitations, swelling, cough, chest pain/pressure.  Past Medical History:  Diagnosis Date   Allergy    GERD (gastroesophageal reflux disease)    Osteopenia    Past Surgical History:  Procedure Laterality Date   CESAREAN SECTION     CHOLECYSTECTOMY     ERCP     UPPER GASTROINTESTINAL ENDOSCOPY     Family History  Problem Relation Age of Onset   Colon cancer Maternal Grandfather    Hypothyroidism Mother    Heart disease Father    Esophageal cancer Neg Hx    Rectal cancer Neg Hx    Stomach cancer Neg Hx    Allergies  Allergen Reactions   Erythromycin     unsure   Reglan [Metoclopramide]     "shaking"   Sulfa Antibiotics Hives    Current Outpatient Medications:    famotidine (PEPCID) 20 MG tablet, Take 20 mg by mouth daily., Disp: , Rfl:    metoprolol succinate (TOPROL-XL) 25 MG 24 hr tablet, Take 12.5 mg by mouth daily., Disp: , Rfl:    venlafaxine XR (EFFEXOR-XR) 150 MG 24 hr capsule, Take 150 mg by mouth daily., Disp: , Rfl:   OBJECTIVE: BP 108/70 (BP Location: Left Arm, Patient Position: Sitting, Cuff Size: Normal)    Pulse 84    Temp (!) 97 F (36.1 C) (Temporal)    Ht 5\' 4"  (1.626 m)    Wt 162 lb 4 oz (73.6 kg)    SpO2 98%    BMI 27.85 kg/m  General:  well developed, well nourished, in no apparent distress Skin:  no  significant moles, warts, or growths Nose:  nares patent, septum midline, mucosa normal Throat/Pharynx:  lips and gingiva without lesion; tongue and uvula midline; non-inflamed pharynx; no exudates or postnasal drainage Lungs:  clear to auscultation, breath sounds equal bilaterally, no respiratory distress Cardio:  regular rate and rhythm, no LE edema or bruits Musculoskeletal:  symmetrical muscle groups noted without atrophy or deformity Neuro:  gait normal Psych: well oriented with normal range of affect and appropriate judgment/insight  ASSESSMENT/PLAN: Fatigue, unspecified type - Plan: CBC, Comprehensive metabolic panel, TSH, VITAMIN D 25 Hydroxy (Vit-D Deficiency, Fractures)  DOE (dyspnea on exertion) - Plan: Magnesium  Insomnia, unspecified type  Orders as above. If labs neg, will ck Echo.  Melatonin, CBT info provided, sleep hygiene info provided, declined medication for this. Patient should return in 1 mo to reck. The patient voiced understanding and agreement to the plan.   [place], DO 04/02/20  2:25 PM

## 2020-04-02 NOTE — Patient Instructions (Signed)
Give [place] 2-3 business days to get the results of your labs back. If labs are normal  Keep the diet clean and stay active.  Aim to do some physical exertion for 150 minutes per week. This is typically divided into 5 days per week, 30 minutes per day. The activity should be enough to get your heart rate up. Anything is better than nothing if you have time constraints.  Sleep is important to [place] all. Getting good sleep is imperative to adequate functioning during the day. Work with our counselors who are trained to help people obtain quality sleep. Call [phone] to schedule an appointment or if you are curious about insurance coverage/cost.  Sleep Hygiene Tips:  Do not watch TV or look at screens within 1 hour of going to bed. If you do, make sure there is a blue light filter (nighttime mode) involved.  Try to go to bed around the same time every night. Wake up at the same time within 1 hour of regular time. Ex: If you wake up at 7 AM for work, do not sleep past 8 AM on days that you don't work.  Do not drink alcohol before bedtime.  Do not consume caffeine-containing beverages after noon or within 9 hours of intended bedtime.  Get regular exercise/physical activity in your life, but not within 2 hours of planned bedtime.  Do not take naps.   Do not eat within 2 hours of planned bedtime.  Melatonin, 3-5 mg 30-60 minutes before planned bedtime may be helpful.   The bed should be for sleep or sex only. If after 20-30 minutes you are unable to fall asleep, get up and do something relaxing. Do this until you feel ready to go to sleep again.   Let us know if you need anything.  Coping skills Choose 5 that work for you:  Take a deep breath  Count to 20  Read a book  Do a puzzle  Meditate  Bake  Sing  Knit  Garden  Pray  Go outside  Call a friend  Listen to music  Take a walk  Color  Send a note  Take a bath  Watch a movie  Be alone in a quiet place  Pet  an animal  Visit a friend  Journal  Exercise  Stretch

## 2020-04-03 LAB — CBC
HCT: 38.7 % (ref 36.0–46.0)
Hemoglobin: 13.1 g/dL (ref 12.0–15.0)
MCHC: 33.8 g/dL (ref 30.0–36.0)
MCV: 88.1 fl (ref 78.0–100.0)
Platelets: 349 10*3/uL (ref 150.0–400.0)
RBC: 4.39 Mil/uL (ref 3.87–5.11)
RDW: 14.6 % (ref 11.5–15.5)
WBC: 9.2 10*3/uL (ref 4.0–10.5)

## 2020-04-03 LAB — TSH: TSH: 3.89 u[IU]/mL (ref 0.35–4.50)

## 2020-04-03 LAB — COMPREHENSIVE METABOLIC PANEL
ALT: 14 U/L (ref 0–35)
AST: 21 U/L (ref 0–37)
Albumin: 4.4 g/dL (ref 3.5–5.2)
Alkaline Phosphatase: 89 U/L (ref 39–117)
BUN: 19 mg/dL (ref 6–23)
CO2: 28 mEq/L (ref 19–32)
Calcium: 10.1 mg/dL (ref 8.4–10.5)
Chloride: 104 mEq/L (ref 96–112)
Creatinine, Ser: 0.84 mg/dL (ref 0.40–1.20)
GFR: 68.93 mL/min (ref >60.00)
Glucose, Bld: 90 mg/dL (ref 70–99)
Potassium: 4.9 mEq/L (ref 3.5–5.1)
Sodium: 139 mEq/L (ref 135–145)
Total Bilirubin: 0.3 mg/dL (ref 0.2–1.2)
Total Protein: 6.6 g/dL (ref 6.0–8.3)

## 2020-04-03 LAB — MAGNESIUM: Magnesium: 2.1 mg/dL (ref 1.5–2.5)

## 2020-04-03 LAB — VITAMIN D 25 HYDROXY (VIT D DEFICIENCY, FRACTURES): VITD: 28.9 ng/mL — ABNORMAL LOW (ref 30.00–100.00)

## 2020-04-04 ENCOUNTER — Other Ambulatory Visit: Payer: Self-pay | Admitting: Family Medicine

## 2020-04-04 DIAGNOSIS — R0609 Other forms of dyspnea: Secondary | ICD-10-CM

## 2020-05-02 ENCOUNTER — Ambulatory Visit: Payer: 59 | Admitting: Family Medicine

## 2020-05-07 ENCOUNTER — Ambulatory Visit (HOSPITAL_BASED_OUTPATIENT_CLINIC_OR_DEPARTMENT_OTHER): Payer: 59

## 2020-05-28 ENCOUNTER — Encounter: Payer: Self-pay | Admitting: Family Medicine

## 2020-05-28 ENCOUNTER — Other Ambulatory Visit: Payer: Self-pay

## 2020-05-28 ENCOUNTER — Ambulatory Visit (INDEPENDENT_AMBULATORY_CARE_PROVIDER_SITE_OTHER): Payer: 59 | Admitting: Family Medicine

## 2020-05-28 VITALS — BP 104/64 | HR 73 | Temp 98.2°F | Ht 64.0 in | Wt 162.5 lb

## 2020-05-28 DIAGNOSIS — R5383 Other fatigue: Secondary | ICD-10-CM

## 2020-05-28 NOTE — Patient Instructions (Addendum)
Consider the counseling.  Keep the diet clean and stay active.  Aim to do some physical exertion for 150 minutes per week. This is typically divided into 5 days per week, 30 minutes per day. The activity should be enough to get your heart rate up. Anything is better than nothing if you have time constraints.  Continue to try to get 7-9 hrs of sleep nightly. I think this was a main contributor.  If you are interested in starting something for anxiety, let me know. I'm not convinced this will be significantly beneficial for the fatigue, but it may help.  Let's hold off on the echo for now.  Let us know if you need anything.  Healthy Eating Plan Many factors influence your heart health, including eating and exercise habits. Heart (coronary) risk increases with abnormal blood fat (lipid) levels. Heart-healthy meal planning includes limiting unhealthy fats, increasing healthy fats, and making other small dietary changes. This includes maintaining a healthy body weight to help keep lipid levels within a normal range.  WHAT IS MY PLAN?  Your health care provider recommends that you:  Drink a glass of water before meals to help with satiety.  Eat slowly.  An alternative to the water is to add Metamucil. This will help with satiety as well. It does contain calories, unlike water.  WHAT TYPES OF FAT SHOULD I CHOOSE?  Choose healthy fats more often. Choose monounsaturated and polyunsaturated fats, such as olive oil and canola oil, flaxseeds, walnuts, almonds, and seeds.  Eat more omega-3 fats. Good choices include salmon, mackerel, sardines, tuna, flaxseed oil, and ground flaxseeds. Aim to eat fish at least two times each week.  Avoid foods with partially hydrogenated oils in them. These contain trans fats. Examples of foods that contain trans fats are stick margarine, some tub margarines, cookies, crackers, and other baked goods. If you are going to avoid a fat, this is the one to  avoid!  WHAT GENERAL GUIDELINES DO I NEED TO FOLLOW?  Check food labels carefully to identify foods with trans fats. Avoid these types of options when possible.  Fill one half of your plate with vegetables and green salads. Eat 4-5 servings of vegetables per day. A serving of vegetables equals 1 cup of raw leafy vegetables,  cup of raw or cooked cut-up vegetables, or  cup of vegetable juice.  Fill one fourth of your plate with whole grains. Look for the word "whole" as the first word in the ingredient list.  Fill one fourth of your plate with lean protein foods.  Eat 4-5 servings of fruit per day. A serving of fruit equals one medium whole fruit,  cup of dried fruit,  cup of fresh, frozen, or canned fruit. Try to avoid fruits in cups/syrups as the sugar content can be high.  Eat more foods that contain soluble fiber. Examples of foods that contain this type of fiber are apples, broccoli, carrots, beans, peas, and barley. Aim to get 20-30 g of fiber per day.  Eat more home-cooked food and less restaurant, buffet, and fast food.  Limit or avoid alcohol.  Limit foods that are high in starch and sugar.  Avoid fried foods when able.  Cook foods by using methods other than frying. Baking, boiling, grilling, and broiling are all great options. Other fat-reducing suggestions include: ? Removing the skin from poultry. ? Removing all visible fats from meats. ? Skimming the fat off of stews, soups, and gravies before serving them. ? Steaming vegetables in  water or broth.  Lose weight if you are overweight. Losing just 5-10% of your initial body weight can help your overall health and prevent diseases such as diabetes and heart disease.  Increase your consumption of nuts, legumes, and seeds to 4-5 servings per week. One serving of dried beans or legumes equals  cup after being cooked, one serving of nuts equals 1 ounces, and one serving of seeds equals  ounce or 1 tablespoon.  WHAT ARE  GOOD FOODS CAN I EAT? Grains Grainy breads (try to find bread that is 3 g of fiber per slice or greater), oatmeal, light popcorn. Whole-grain cereals. Rice and pasta, including brown rice and those that are made with whole wheat. Edamame pasta is a great alternative to grain pasta. It has a higher protein content. Try to avoid significant consumption of white bread, sugary cereals, or pastries/baked goods.  Vegetables All vegetables. Cooked white potatoes do not count as vegetables.  Fruits All fruits, but limit pineapple and bananas as these fruits have a higher sugar content.  Meats and Other Protein Sources Lean, well-trimmed beef, veal, pork, and lamb. Chicken and [place] without skin. All fish and shellfish. Wild duck, rabbit, pheasant, and venison. Egg whites or low-cholesterol egg substitutes. Dried beans, peas, lentils, and tofu.Seeds and most nuts.  Dairy Low-fat or nonfat cheeses, including ricotta, string, and mozzarella. Skim or 1% milk that is liquid, powdered, or evaporated. Buttermilk that is made with low-fat milk. Nonfat or low-fat yogurt. Soy/Almond milk are good alternatives if you cannot handle dairy.  Beverages Water is the best for you. Sports drinks with less sugar are more desirable unless you are a highly active athlete.  Sweets and Desserts Sherbets and fruit ices. Honey, jam, marmalade, jelly, and syrups. Dark chocolate.  Eat all sweets and desserts in moderation.  Fats and Oils Nonhydrogenated (trans-free) margarines. Vegetable oils, including soybean, sesame, sunflower, olive, peanut, safflower, corn, canola, and cottonseed. Salad dressings or mayonnaise that are made with a vegetable oil. Limit added fats and oils that you use for cooking, baking, salads, and as spreads.  Other Cocoa powder. Coffee and tea. Most condiments.  The items listed above may not be a complete list of recommended foods or beverages. Contact your dietitian for more options.

## 2020-05-28 NOTE — Progress Notes (Signed)
Chief Complaint  Patient presents with  . Follow-up    Subjective: Patient is a 59 y.o. female here for f/u fatigue.  Slightly better since last time. Doubled dosage of Vit D since it was slightly low. Took melatonin and got some better sleep. Went from 5 hrs/night to 6-7 hrs. Reports 40-50% improvement.  Diet is healthy overall, she is trying to exercise as much as possible.  The hot weather is limiting her.  She does have some anxiety in dealing with her elderly father who is 44.  Not bad enough to start a medicine.  She is not following with a counselor or psychologist but did get the information at the last visit.  Still no reports of apnea or waking up gasping for air.  Past Medical History:  Diagnosis Date  . Allergy   . GERD (gastroesophageal reflux disease)   . Osteopenia     Objective: BP 104/64 (BP Location: Left Arm, Patient Position: Sitting, Cuff Size: Normal)   Pulse 73   Temp 98.2 F (36.8 C) (Oral)   Ht 5\' 4"  (1.626 m)   Wt 162 lb 8 oz (73.7 kg)   SpO2 96%   BMI 27.89 kg/m  General: Awake, appears stated age HEENT: MMM, EOMi Heart: RRR, no lower extremity edema Lungs: CTAB, no rales, wheezes or rhonchi. No accessory muscle use Psych: Age appropriate judgment and insight, normal affect and mood  Assessment and Plan: Fatigue, unspecified type  I think she could benefit more from closer to 7 hours of sleep nightly.  I think she is better overall.  Counseled on diet and exercise.  Offered medicine for anxiety but doubt it would allow her significant improvement beyond what she is already experienced. I will see her in 6 months for physical or as needed. The patient voiced understanding and agreement to the plan.  [place], DO 05/28/20  10:17 AM

## 2020-12-03 ENCOUNTER — Encounter: Payer: Self-pay | Admitting: Family Medicine

## 2020-12-03 ENCOUNTER — Other Ambulatory Visit: Payer: Self-pay

## 2020-12-03 ENCOUNTER — Ambulatory Visit (INDEPENDENT_AMBULATORY_CARE_PROVIDER_SITE_OTHER): Payer: 59 | Admitting: Family Medicine

## 2020-12-03 VITALS — BP 112/68 | HR 75 | Temp 98.0°F | Ht 64.0 in | Wt 164.1 lb

## 2020-12-03 DIAGNOSIS — Z1331 Encounter for screening for depression: Secondary | ICD-10-CM

## 2020-12-03 DIAGNOSIS — Z0000 Encounter for general adult medical examination without abnormal findings: Secondary | ICD-10-CM

## 2020-12-03 DIAGNOSIS — Z23 Encounter for immunization: Secondary | ICD-10-CM | POA: Diagnosis not present

## 2020-12-03 LAB — COMPREHENSIVE METABOLIC PANEL
ALT: 20 U/L (ref 0–35)
AST: 24 U/L (ref 0–37)
Albumin: 4.2 g/dL (ref 3.5–5.2)
Alkaline Phosphatase: 88 U/L (ref 39–117)
BUN: 16 mg/dL (ref 6–23)
CO2: 29 mEq/L (ref 19–32)
Calcium: 9.9 mg/dL (ref 8.4–10.5)
Chloride: 106 mEq/L (ref 96–112)
Creatinine, Ser: 0.79 mg/dL (ref 0.40–1.20)
GFR: 80.6 mL/min (ref >60.00)
Glucose, Bld: 87 mg/dL (ref 70–99)
Potassium: 4.6 mEq/L (ref 3.5–5.1)
Sodium: 140 mEq/L (ref 135–145)
Total Bilirubin: 0.5 mg/dL (ref 0.2–1.2)
Total Protein: 6.8 g/dL (ref 6.0–8.3)

## 2020-12-03 LAB — LIPID PANEL
Cholesterol: 196 mg/dL (ref 0–200)
HDL: 71 mg/dL (ref >39.00)
LDL Cholesterol: 99 mg/dL (ref 0–99)
NonHDL: 125.16
Total CHOL/HDL Ratio: 3
Triglycerides: 130 mg/dL (ref 0.0–149.0)
VLDL: 26 mg/dL (ref 0.0–40.0)

## 2020-12-03 NOTE — Patient Instructions (Addendum)
Give [place] 2-3 business days to get the results of your labs back.   Keep the diet clean and stay active.  Aim to do some physical exertion for 150 minutes per week. This is typically divided into 5 days per week, 30 minutes per day. The activity should be enough to get your heart rate up. Anything is better than nothing if you have time constraints. Focus on weight resistance exercise as well.   The new Shingrix vaccine (for shingles) is a 2 shot series. It can make people feel low energy, achy and almost like they have the flu for 48 hours after injection. Please plan accordingly when deciding on when to get this shot. Call our office for a nurse visit appointment to get this. The second shot of the series is less severe regarding the side effects, but it still lasts 48 hours.   Consider getting 75 of protein in daily.   Please consider counseling. Contact [phone] to schedule an appointment or inquire about cost/insurance coverage.  Let us know if you need anything.

## 2020-12-03 NOTE — Addendum Note (Signed)
Addended by: Scharlene Gloss B on: 12/03/2020 10:15 AM   Modules accepted: Orders

## 2020-12-03 NOTE — Progress Notes (Signed)
Chief Complaint  Patient presents with  . Annual Exam     Well Woman Jasmine Wells is here for a complete physical.   Her last physical was >1 year ago.  Current diet: in general, a "healthy" diet. Current exercise: some walking and wt lifting. Weight is stable and she denies fatigue out of ordinary. Seatbelt? Yes Loss of interested in doing things or depression in past 2 weeks? No; she is having some situational stress.  Health Maintenance Pap/HPV- Yes Mammogram- Yes Colon cancer screening-Yes Shingrix- No Tetanus- No Hep C screening- Yes HIV screening- Yes  Past Medical History:  Diagnosis Date  . Allergy   . GERD (gastroesophageal reflux disease)   . Osteopenia      Past Surgical History:  Procedure Laterality Date  . CESAREAN SECTION    . CHOLECYSTECTOMY    . ERCP    . UPPER GASTROINTESTINAL ENDOSCOPY      Medications  Current Outpatient Medications on File Prior to Visit  Medication Sig Dispense Refill  . famotidine (PEPCID) 20 MG tablet Take 20 mg by mouth daily.    . metoprolol succinate (TOPROL-XL) 25 MG 24 hr tablet Take 12.5 mg by mouth daily.    Marland Kitchen venlafaxine XR (EFFEXOR-XR) 150 MG 24 hr capsule Take 150 mg by mouth daily.      Allergies Allergies  Allergen Reactions  . Erythromycin     unsure  . Reglan [Metoclopramide]     "shaking"  . Sulfa Antibiotics Hives    Review of Systems: Constitutional:  no unexpected weight changes Eye:  no recent significant change in vision Ear/Nose/Mouth/Throat:  Ears:  no recent change in hearing Nose/Mouth/Throat:  no complaints of nasal congestion, no sore throat Cardiovascular: no chest pain Respiratory:  no shortness of breath Gastrointestinal:  no abdominal pain, no change in bowel habits GU:  Female: negative for dysuria or pelvic pain Musculoskeletal/Extremities:  no pain of the joints Integumentary (Skin/Breast):  no abnormal skin lesions reported Neurologic:  no headaches Endocrine:  denies  fatigue  Exam BP 112/68 (BP Location: Left Arm, Patient Position: Sitting, Cuff Size: Normal)   Pulse 75   Temp 98 F (36.7 C) (Oral)   Ht 5\' 4"  (1.626 m)   Wt 164 lb 2 oz (74.4 kg)   SpO2 97%   BMI 28.17 kg/m  General:  well developed, well nourished, in no apparent distress Skin:  no significant moles, warts, or growths Head:  no masses, lesions, or tenderness Eyes:  pupils equal and round, sclera anicteric without injection Ears:  canals without lesions, TMs shiny without retraction, no obvious effusion, no erythema Nose:  nares patent, septum midline, mucosa normal, and no drainage or sinus tenderness Throat/Pharynx:  lips and gingiva without lesion; tongue and uvula midline; non-inflamed pharynx; no exudates or postnasal drainage Neck: neck supple without adenopathy, thyromegaly, or masses Lungs:  clear to auscultation, breath sounds equal bilaterally, no respiratory distress Cardio:  regular rate and rhythm, no LE edema Abdomen:  abdomen soft, nontender; bowel sounds normal; no masses or organomegaly Genital: Defer to GYN Musculoskeletal:  symmetrical muscle groups noted without atrophy or deformity Extremities:  no clubbing, cyanosis, or edema, no deformities, no skin discoloration Neuro:  gait normal; deep tendon reflexes normal and symmetric Psych: well oriented with normal range of affect and appropriate judgment/insight  Assessment and Plan  Well adult exam - Plan: Lipid panel, Comprehensive metabolic panel  Depression screening negative   Well 60 y.o. female. Counseled on diet and exercise. Other  orders as above. Tdap today.  Follow up in 6 mo or prn. The patient voiced understanding and agreement to the plan.  Jilda Roche [place], DO 12/03/20 9:59 AM

## 2021-06-03 ENCOUNTER — Encounter: Payer: Self-pay | Admitting: Family Medicine

## 2021-06-03 ENCOUNTER — Ambulatory Visit: Payer: 59 | Admitting: Family Medicine

## 2021-06-03 ENCOUNTER — Other Ambulatory Visit: Payer: Self-pay

## 2021-06-03 VITALS — BP 108/72 | HR 74 | Temp 98.0°F | Ht 64.0 in | Wt 165.4 lb

## 2021-06-03 DIAGNOSIS — R42 Dizziness and giddiness: Secondary | ICD-10-CM | POA: Diagnosis not present

## 2021-06-03 DIAGNOSIS — R002 Palpitations: Secondary | ICD-10-CM | POA: Diagnosis not present

## 2021-06-03 DIAGNOSIS — G4700 Insomnia, unspecified: Secondary | ICD-10-CM | POA: Diagnosis not present

## 2021-06-03 LAB — CBC
HCT: 39.5 % (ref 36.0–46.0)
Hemoglobin: 12.9 g/dL (ref 12.0–15.0)
MCHC: 32.6 g/dL (ref 30.0–36.0)
MCV: 88.2 fl (ref 78.0–100.0)
Platelets: 318 10*3/uL (ref 150.0–400.0)
RBC: 4.47 Mil/uL (ref 3.87–5.11)
RDW: 13.9 % (ref 11.5–15.5)
WBC: 5.5 10*3/uL (ref 4.0–10.5)

## 2021-06-03 LAB — COMPREHENSIVE METABOLIC PANEL
ALT: 15 U/L (ref 0–35)
AST: 22 U/L (ref 0–37)
Albumin: 4.1 g/dL (ref 3.5–5.2)
Alkaline Phosphatase: 82 U/L (ref 39–117)
BUN: 15 mg/dL (ref 6–23)
CO2: 29 mEq/L (ref 19–32)
Calcium: 9.9 mg/dL (ref 8.4–10.5)
Chloride: 104 mEq/L (ref 96–112)
Creatinine, Ser: 0.9 mg/dL (ref 0.40–1.20)
GFR: 68.69 mL/min (ref >60.00)
Glucose, Bld: 71 mg/dL (ref 70–99)
Potassium: 4.6 mEq/L (ref 3.5–5.1)
Sodium: 139 mEq/L (ref 135–145)
Total Bilirubin: 0.4 mg/dL (ref 0.2–1.2)
Total Protein: 6.7 g/dL (ref 6.0–8.3)

## 2021-06-03 LAB — MAGNESIUM: Magnesium: 2 mg/dL (ref 1.5–2.5)

## 2021-06-03 LAB — TSH: TSH: 2.78 u[IU]/mL (ref 0.35–5.50)

## 2021-06-03 MED ORDER — TRAZODONE HCL 50 MG PO TABS: 25.0000 mg | ORAL_TABLET | Freq: Every evening | ORAL | 3 refills | Status: DC | PRN

## 2021-06-03 NOTE — Patient Instructions (Addendum)
Give [place] 2-3 business days to get the results of your labs back.   If you do not hear anything about your referral in the next 1-2 weeks, call our office and ask for an update.  Try to drink 55-60 oz of water daily outside of exercise.  Sleep Hygiene Tips: Do not watch TV or look at screens within 1 hour of going to bed. If you do, make sure there is a blue light filter (nighttime mode) involved. Try to go to bed around the same time every night. Wake up at the same time within 1 hour of regular time. Ex: If you wake up at 7 AM for work, do not sleep past 8 AM on days that you don't work. Do not drink alcohol before bedtime. Do not consume caffeine-containing beverages after noon or within 9 hours of intended bedtime. Get regular exercise/physical activity in your life, but not within 2 hours of planned bedtime. Do not take naps.  Do not eat within 2 hours of planned bedtime. Melatonin, 3-5 mg 30-60 minutes before planned bedtime may be helpful.  The bed should be for sleep or sex only. If after 20-30 minutes you are unable to fall asleep, get up and do something relaxing. Do this until you feel ready to go to sleep again.   Sleep is important to [place] all. Getting good sleep is imperative to adequate functioning during the day. Work with our counselors who are trained to help people obtain quality sleep. Call [phone] to schedule an appointment or if you are curious about insurance coverage/cost.  Let us know if you need anything.

## 2021-06-03 NOTE — Progress Notes (Signed)
Chief Complaint  Patient presents with   Follow-up   Dizziness    Jasmine Wells is 60 y.o. pt here for dizziness.  Duration: 4 months Pass out? No Spinning? Yes Lightheaded?  Yes, lasting longer than simple positional changes Sensation lasts for up to 2 to 3 hours. Recent illness/fever? No Headache? No Neurologic signs? No Change in PO intake? No Associated s/s's: fatigue, N/V, +fluttering in chest when it happens.  Does not drink much water.  Diet is fair.   Patient has been dealing with insomnia for several months.  She has trouble falling asleep rather than staying asleep.  She has tried Tylenol PM and melatonin without significant relief.  She is not following with a therapist at this time.  No known history of depression or anxiety.  Past Medical History:  Diagnosis Date   Allergy    GERD (gastroesophageal reflux disease)    Osteopenia     Family History  Problem Relation Age of Onset   Colon cancer Maternal Grandfather    Hypothyroidism Mother    Heart disease Father    Esophageal cancer Neg Hx    Rectal cancer Neg Hx    Stomach cancer Neg Hx     Allergies as of 06/03/2021       Reactions   Erythromycin    unsure   Reglan [metoclopramide]    "shaking"   Sulfa Antibiotics Hives        Medication List        Accurate as of June 03, 2021  9:33 AM. If you have any questions, ask your nurse or doctor.          famotidine 20 MG tablet Commonly known as: PEPCID Take 20 mg by mouth daily.   metoprolol succinate 25 MG 24 hr tablet Commonly known as: TOPROL-XL Take 12.5 mg by mouth daily.   traZODone 50 MG tablet Commonly known as: DESYREL Take 0.5-1 tablets (25-50 mg total) by mouth at bedtime as needed for sleep. Started by: Sharlene Dory, DO   venlafaxine XR 150 MG 24 hr capsule Commonly known as: EFFEXOR-XR Take 150 mg by mouth daily.        BP 108/72   Pulse 74   Temp 98 F (36.7 C) (Oral)   Ht 5\' 4"  (1.626 m)   Wt  165 lb 6 oz (75 kg)   SpO2 98%   BMI 28.39 kg/m  General: Awake, alert, appears stated age Eyes: PERRLA, EOMi Ears: Patent, TM's neg b/l Heart: RRR, no murmurs, no carotid bruits Lungs: CTAB, no accessory muscle use MSK: 5/5 strength throughout, gait normal Neuro: No cerebellar signs, patellar reflex 2/4 b/l wo clonus, calcaneal reflex 1/4 b/l wo clonus, biceps reflex 2/4 b/l wo clonus; Dix-Hall-Pike negative bilaterally. Psych: Age appropriate judgment and insight, normal mood and affect  Lightheaded - Plan: Ambulatory referral to Cardiology  Palpitations - Plan: CBC, Comprehensive metabolic panel, TSH, Magnesium, Ambulatory referral to Cardiology  Insomnia, unspecified type - Plan: traZODone (DESYREL) 50 MG tablet  1/2.  New problem, uncertain prognosis.  Unlikely to be vertigo.  Refer to cardiology for lightheaded/palpitation evaluation.  I do think there is a component of orthostasis and recommend she increase her fluid intake.  We will check labs as above as well. 3.  New problem, chronic.  Trial trazodone 25-50 mg as needed at nighttime. [facility] info provided.  Sleep hygiene information verbalized and written down as well. F/u in 4 weeks to recheck this. Pt voiced understanding  and agreement to the plan.  Jilda Roche [place], DO 06/03/21 9:33 AM

## 2021-06-10 ENCOUNTER — Other Ambulatory Visit: Payer: Self-pay | Admitting: Family Medicine

## 2021-06-10 ENCOUNTER — Telehealth: Payer: Self-pay

## 2021-06-10 MED ORDER — VENLAFAXINE HCL ER 150 MG PO CP24: 150.0000 mg | ORAL_CAPSULE | Freq: Every day | ORAL | Status: AC

## 2021-06-10 MED ORDER — BUSPIRONE HCL 7.5 MG PO TABS: 7.5000 mg | ORAL_TABLET | Freq: Two times a day (BID) | ORAL | 2 refills | Status: DC | PRN

## 2021-06-10 MED ORDER — CITALOPRAM HYDROBROMIDE 20 MG PO TABS: 20.0000 mg | ORAL_TABLET | Freq: Every day | ORAL | 3 refills | Status: DC

## 2021-06-10 NOTE — Telephone Encounter (Signed)
Called and informed the patient of instructions. The patient did not want to stop effexor. PCP sent in alternative she could take along with effexor/but to be used as needed. Canceled Citalopram at the pharmacy. The patient verbalized understanding.

## 2021-06-10 NOTE — Telephone Encounter (Signed)
Cont Effexor. Cancel Celexa. BuSpar bid prn sent. F/u as originally scheduled.

## 2021-06-10 NOTE — Telephone Encounter (Signed)
Sent in a new med 1/2 tab daily for 2 weeks then 1 tab daily. Make sure she is not taking any Effexor/venlefaxine. We can follow up as originally scheduled. Ty.

## 2021-06-10 NOTE — Telephone Encounter (Signed)
Pt would a call about medications.

## 2021-06-10 NOTE — Telephone Encounter (Signed)
Called the patient and she is in need for something mild for Anxiety. She did say it was discussed at her recent appointment.

## 2021-06-25 ENCOUNTER — Other Ambulatory Visit: Payer: Self-pay | Admitting: Family Medicine

## 2021-06-25 DIAGNOSIS — G4700 Insomnia, unspecified: Secondary | ICD-10-CM

## 2021-07-02 ENCOUNTER — Other Ambulatory Visit: Payer: Self-pay | Admitting: Family Medicine

## 2021-07-17 ENCOUNTER — Other Ambulatory Visit: Payer: Self-pay

## 2021-07-17 ENCOUNTER — Encounter: Payer: Self-pay | Admitting: Family Medicine

## 2021-07-17 ENCOUNTER — Ambulatory Visit: Payer: 59 | Admitting: Family Medicine

## 2021-07-17 VITALS — BP 108/68 | HR 80 | Temp 97.8°F | Ht 64.0 in | Wt 167.0 lb

## 2021-07-17 DIAGNOSIS — G4700 Insomnia, unspecified: Secondary | ICD-10-CM | POA: Insufficient documentation

## 2021-07-17 DIAGNOSIS — R0609 Other forms of dyspnea: Secondary | ICD-10-CM

## 2021-07-17 HISTORY — DX: Insomnia, unspecified: G47.00

## 2021-07-17 NOTE — Patient Instructions (Addendum)
We are stopping your blood pressure medicine (metoprolol).  We are ordering lung tests. Schedule after your workup with the cardiologist if things are normal. Don't schedule if something is found. Also message me 1-2 weeks after your final test with cardiology if things are normal and I can order a chest X-ray as well.   Stay hydrated and eat regular meals.   Let me know if anything changes.

## 2021-07-17 NOTE — Progress Notes (Signed)
Chief Complaint  Patient presents with   Follow-up    Subjective: Patient is a 60 y.o. female here for f/u.  DOE No episodes of lightheadedness, but still having DOE. Has appt w cardiology in 1 week. She takes Toprol XL 12.5 mg daily for elevated BP she was placed on in the past. Increased hydration that did not make a notable difference. Eats at reg intervals. Nonsmoker, no exposure. No hx of COPD, asthma, wheezing, coughing.   Insomnia 1 mo ago she was started on trazodone 25-50 mg qhs prn for insomnia of several mo. [facility] info provided as well as discussing sleep hygiene. Since that time, she reports no improvement. Took trazodone nightly for 2 nights and stopped 2/2 heartburn. She did not set up w [facility] team. She is doing well with the BuSpar, taking 7.5 mg usually daily. This is helping in addition to taking her regular Effexor 150 mg/d. No AE's.   Past Medical History:  Diagnosis Date   Allergy    GERD (gastroesophageal reflux disease)    Osteopenia     Objective: BP 108/68   Pulse 80   Temp 97.8 F (36.6 C) (Oral)   Ht 5\' 4"  (1.626 m)   Wt 167 lb (75.8 kg)   SpO2 99%   BMI 28.67 kg/m  General: Awake, appears stated age HEENT: MMM, EOMi Heart: RRR, no murmurs, no bruits, no LE edema Lungs: CTAB, no rales, wheezes or rhonchi. No accessory muscle use Psych: Age appropriate judgment and insight, normal affect and mood  Assessment and Plan: DOE (dyspnea on exertion) - Plan: Pulmonary Function Test  Insomnia, unspecified type  Chronic, uncontrolled. Stop Toprol. Will await cardiology input. Will ck CXR and PFT's if workup neg.  2.   Chronic, stable. Cont Buspar w Effexor.  F/u pending cardiology results.  The patient voiced understanding and agreement to the plan.  [place], DO 07/17/21  8:50 AM

## 2021-07-20 DIAGNOSIS — M8580 Other specified disorders of bone density and structure, unspecified site: Secondary | ICD-10-CM | POA: Insufficient documentation

## 2021-07-20 DIAGNOSIS — M810 Age-related osteoporosis without current pathological fracture: Secondary | ICD-10-CM

## 2021-07-20 DIAGNOSIS — K589 Irritable bowel syndrome without diarrhea: Secondary | ICD-10-CM | POA: Insufficient documentation

## 2021-07-20 DIAGNOSIS — F419 Anxiety disorder, unspecified: Secondary | ICD-10-CM

## 2021-07-20 DIAGNOSIS — R1033 Periumbilical pain: Secondary | ICD-10-CM

## 2021-07-20 DIAGNOSIS — G43909 Migraine, unspecified, not intractable, without status migrainosus: Secondary | ICD-10-CM | POA: Insufficient documentation

## 2021-07-20 DIAGNOSIS — F411 Generalized anxiety disorder: Secondary | ICD-10-CM

## 2021-07-20 DIAGNOSIS — T7840XA Allergy, unspecified, initial encounter: Secondary | ICD-10-CM | POA: Insufficient documentation

## 2021-07-20 DIAGNOSIS — Z8719 Personal history of other diseases of the digestive system: Secondary | ICD-10-CM

## 2021-07-20 DIAGNOSIS — F32A Depression, unspecified: Secondary | ICD-10-CM | POA: Insufficient documentation

## 2021-07-20 HISTORY — DX: Migraine, unspecified, not intractable, without status migrainosus: G43.909

## 2021-07-20 HISTORY — DX: Irritable bowel syndrome, unspecified: K58.9

## 2021-07-20 HISTORY — DX: Generalized anxiety disorder: F41.1

## 2021-07-20 HISTORY — DX: Age-related osteoporosis without current pathological fracture: M81.0

## 2021-07-20 HISTORY — DX: Personal history of other diseases of the digestive system: Z87.19

## 2021-07-20 HISTORY — DX: Depression, unspecified: F32.A

## 2021-07-20 HISTORY — DX: Periumbilical pain: R10.33

## 2021-07-20 HISTORY — DX: Anxiety disorder, unspecified: F41.9

## 2021-07-24 ENCOUNTER — Ambulatory Visit (INDEPENDENT_AMBULATORY_CARE_PROVIDER_SITE_OTHER): Payer: 59

## 2021-07-24 ENCOUNTER — Other Ambulatory Visit: Payer: Self-pay

## 2021-07-24 ENCOUNTER — Ambulatory Visit: Payer: 59 | Admitting: Cardiology

## 2021-07-24 ENCOUNTER — Encounter: Payer: Self-pay | Admitting: Cardiology

## 2021-07-24 VITALS — BP 116/82 | HR 80 | Ht 64.0 in | Wt 167.0 lb

## 2021-07-24 DIAGNOSIS — R42 Dizziness and giddiness: Secondary | ICD-10-CM

## 2021-07-24 DIAGNOSIS — R0609 Other forms of dyspnea: Secondary | ICD-10-CM

## 2021-07-24 DIAGNOSIS — I10 Essential (primary) hypertension: Secondary | ICD-10-CM | POA: Diagnosis not present

## 2021-07-24 DIAGNOSIS — R002 Palpitations: Secondary | ICD-10-CM

## 2021-07-24 NOTE — Patient Instructions (Signed)
Medication Instructions:  Your physician recommends that you continue on your current medications as directed. Please refer to the Current Medication list given to you today.  *If you need a refill on your cardiac medications before your next appointment, please call your pharmacy*   Lab Work: None If you have labs (blood work) drawn today and your tests are completely normal, you will receive your results only by: MyChart Message (if you have MyChart) OR A paper copy in the mail If you have any lab test that is abnormal or we need to change your treatment, we will call you to review the results.   Testing/Procedures: Your physician has requested that you have an echocardiogram. Echocardiography is a painless test that uses sound waves to create images of your heart. It provides your doctor with information about the size and shape of your heart and how well your heart's chambers and valves are working. This procedure takes approximately one hour. There are no restrictions for this procedure.  A zio monitor was ordered today. It will remain on for 14 days. You will then return monitor and event diary in provided box. It takes 1-2 weeks for report to be downloaded and returned to us. We will call you with the results. If monitor falls off or has orange flashing light, please call Zio for further instructions.     Follow-Up: At [facility], you and your health needs are our priority.  As part of our continuing mission to provide you with exceptional heart care, we have created designated Provider Care Teams.  These Care Teams include your primary Cardiologist (physician) and Advanced Practice Providers (APPs -  Physician Assistants and Nurse Practitioners) who all work together to provide you with the care you need, when you need it.  We recommend signing up for the patient portal called "MyChart".  Sign up information is provided on this After Visit Summary.  MyChart is used to connect with  patients for Virtual Visits (Telemedicine).  Patients are able to view lab/test results, encounter notes, upcoming appointments, etc.  Non-urgent messages can be sent to your provider as well.   To learn more about what you can do with MyChart, go to [email or web address].    Your next appointment:   2 month(s)  The format for your next appointment:   In Person  Provider:   Robert Krasowski, MD    Other Instructions   

## 2021-07-24 NOTE — Progress Notes (Signed)
Cardiology Consultation:    Date:  07/24/2021   ID:  Jasmine Wells, DOB Jul 30, 1961, MRN [id number]  PCP:  Sharlene Dory, DO  Cardiologist:  Gypsy Balsam, MD   Referring MD: Sharlene Dory*   Chief Complaint  Patient presents with   Dizziness   Palpitations   Shortness of Breath    History of Present Illness:    Jasmine Wells is a 60 y.o. female who is being seen today for the evaluation of shortness of breath and palpitations at the request of Sharlene Dory*.  She gradually noted excessive shortness of breath.  She have 1 flight of stairs at home for about a year and then she climb up to the stairs she will be severely short of breath to the point that sometimes she has to stop and catch her breath.  There is no chest pain tightness squeezing pressure mid chest there is no palpitation associated with this sensation but she feels dizzy when it happened.  She also described to have some dizziness that have been different situation but symptoms with exercise.  Not to the point of passing out.  She also described a situation that her heart will speed up actually she is being put on small dose of beta-blocker recently she tried to stop it and she got horrible evening when her heart speeding up and she went back on this medication.  She does have difficulty sleeping she have difficulty falling asleep.  She reports to have very stressful life.  She is taking care of her  father which is elevated stress for her.  She does not smoke never did, she does have family history of CVA and atrial fibrillation.  She does not exercise on the regular basis used to but does not do anymore because simply does not have time.  She is not on any special diet.  Past Medical History:  Diagnosis Date   Allergic rhinitis 05/05/2012   Allergy    Anxiety 07/20/2021   Depressive disorder 07/20/2021   DOE (dyspnea on exertion) 12/28/2017   Essential hypertension 12/28/2017   Generalized  anxiety disorder 07/20/2021   GERD (gastroesophageal reflux disease)    History of irritable bowel syndrome 07/20/2021   Insomnia 07/17/2021   Migraine headache 07/20/2021   Osteopenia    Osteoporosis 07/20/2021   Periumbilical pain 07/20/2021   Spastic colon 07/20/2021    Past Surgical History:  Procedure Laterality Date   CESAREAN SECTION     CHOLECYSTECTOMY     ERCP     UPPER GASTROINTESTINAL ENDOSCOPY      Current Medications: Current Meds  Medication Sig   busPIRone (BUSPAR) 7.5 MG tablet TAKE 1 TABLET (7.5 MG TOTAL) BY MOUTH 2 (TWO) TIMES DAILY AS NEEDED (ANXIETY/PANIC).   famotidine (PEPCID) 20 MG tablet Take 20 mg by mouth daily.   metoprolol succinate (TOPROL-XL) 25 MG 24 hr tablet Take 12.5 mg by mouth daily.   venlafaxine XR (EFFEXOR-XR) 150 MG 24 hr capsule Take 1 capsule (150 mg total) by mouth daily.     Allergies:   Erythromycin, Prochlorperazine, Promethazine hcl, Reglan [metoclopramide], and Sulfa antibiotics   Social History   Socioeconomic History   Marital status: Married    Spouse name: Not on file   Number of children: Not on file   Years of education: Not on file   Highest education level: Not on file  Occupational History   Not on file  Tobacco Use   Smoking status: Never  Smokeless tobacco: Never  Substance and Sexual Activity   Alcohol use: No   Drug use: No   Sexual activity: Not on file  Other Topics Concern   Not on file  Social History Narrative   Not on file   Social Determinants of Health   Financial Resource Strain: Not on file  Food Insecurity: Not on file  Transportation Needs: Not on file  Physical Activity: Not on file  Stress: Not on file  Social Connections: Not on file     Family History: The patient's family history includes Colon cancer in her maternal grandfather; Heart disease in her father; Hypothyroidism in her mother. There is no history of Esophageal cancer, Rectal cancer, or Stomach cancer. ROS:    Please see the history of present illness.    All 14 point review of systems negative except as described per history of present illness.  EKGs/Labs/Other Studies Reviewed:    The following studies were reviewed today:   EKG:  EKG is  ordered today.  The ekg ordered today demonstrates normal sinus rhythm, normal P interval, normal QS complex duration fulgent no ST segment changes  Recent Labs: 06/03/2021: ALT 15; BUN 15; Creatinine, Ser 0.90; Hemoglobin 12.9; Magnesium 2.0; Platelets 318.0; Potassium 4.6; Sodium 139; TSH 2.78  Recent Lipid Panel    Component Value Date/Time   CHOL 196 12/03/2020 1002   TRIG 130.0 12/03/2020 1002   HDL 71.00 12/03/2020 1002   CHOLHDL 3 12/03/2020 1002   VLDL 26.0 12/03/2020 1002   LDLCALC 99 12/03/2020 1002    Physical Exam:    VS:  BP 116/82 (BP Location: Right Arm, Patient Position: Sitting)   Pulse 80   Ht 5\' 4"  (1.626 m)   Wt 167 lb (75.8 kg)   SpO2 96%   BMI 28.67 kg/m     Wt Readings from Last 3 Encounters:  07/24/21 167 lb (75.8 kg)  07/17/21 167 lb (75.8 kg)  06/03/21 165 lb 6 oz (75 kg)     GEN:  Well nourished, well developed in no acute distress HEENT: Normal NECK: No JVD; No carotid bruits LYMPHATICS: No lymphadenopathy CARDIAC: RRR, no murmurs, no rubs, no gallops RESPIRATORY:  Clear to auscultation without rales, wheezing or rhonchi  ABDOMEN: Soft, non-tender, non-distended MUSCULOSKELETAL:  No edema; No deformity  SKIN: Warm and dry NEUROLOGIC:  Alert and oriented x 3 PSYCHIATRIC:  Normal affect   ASSESSMENT:    1. Dyspnea on exertion   2. Palpitations   3. Lightheadedness   4. Essential hypertension    PLAN:    In order of problems listed above:  Dyspnea on exertion.  I will ask her to have an echocardiogram to assess left ventricle ejection fraction will look at systolic and diastolic function.  If that will be unrevealing we may investigate also potential coronary artery disease being concerned about  potentially having anginal equivalent.  I will not initiate any medication until have better understanding what is going on. Palpitations with lightheadedness.  Monitor will be placed she will wear Zio patch for 2 weeks.  She is small dose of beta-blocker which I will continue for now. Essential hypertension her blood pressure seems to be perfectly controlled but she said she is taking small dose of beta-blocker to help with the blood pressure but she does not remember when last time she had elevated blood pressure. Dyslipidemia I did review her K PN which show me her LDL of 99 and HDL of 71.  For now we  will continue present medications.   Medication Adjustments/Labs and Tests Ordered: Current medicines are reviewed at length with the patient today.  Concerns regarding medicines are outlined above.  No orders of the defined types were placed in this encounter.  No orders of the defined types were placed in this encounter.   Signed, Georgeanna Lea, MD, [facility]. 07/24/2021 9:11 AM    [facility]

## 2021-08-14 ENCOUNTER — Ambulatory Visit (HOSPITAL_BASED_OUTPATIENT_CLINIC_OR_DEPARTMENT_OTHER)
Admission: RE | Admit: 2021-08-14 | Discharge: 2021-08-14 | Disposition: A | Discharge status: Home / Self Care | Payer: 59 | Source: Ambulatory Visit | Attending: Cardiology | Admitting: Cardiology

## 2021-08-14 ENCOUNTER — Other Ambulatory Visit: Payer: Self-pay

## 2021-08-14 DIAGNOSIS — R0609 Other forms of dyspnea: Secondary | ICD-10-CM | POA: Diagnosis present

## 2021-08-14 DIAGNOSIS — R42 Dizziness and giddiness: Secondary | ICD-10-CM

## 2021-08-14 DIAGNOSIS — R002 Palpitations: Secondary | ICD-10-CM

## 2021-08-14 DIAGNOSIS — I10 Essential (primary) hypertension: Secondary | ICD-10-CM | POA: Diagnosis present

## 2021-08-14 LAB — ECHOCARDIOGRAM COMPLETE
AR max vel: 2.23 cm2
AV Area VTI: 1.94 cm2
AV Area mean vel: 2.11 cm2
AV Mean grad: 4 mmHg
AV Peak grad: 6.9 mmHg
Ao pk vel: 1.31 m/s
Area-P 1/2: 3.06 cm2
Calc EF: 70.3 %
S' Lateral: 2.5 cm
Single Plane A2C EF: 60 %
Single Plane A4C EF: 78.3 %

## 2021-09-23 ENCOUNTER — Ambulatory Visit: Payer: 59 | Admitting: Cardiology

## 2022-09-03 ENCOUNTER — Ambulatory Visit: Payer: 59 | Admitting: Family Medicine

## 2022-09-03 ENCOUNTER — Telehealth: Payer: Self-pay | Admitting: Family Medicine

## 2022-09-03 ENCOUNTER — Encounter: Payer: Self-pay | Admitting: Family Medicine

## 2022-09-03 VITALS — BP 120/76 | HR 83 | Temp 98.2°F | Ht 64.5 in | Wt 166.0 lb

## 2022-09-03 DIAGNOSIS — Z1211 Encounter for screening for malignant neoplasm of colon: Secondary | ICD-10-CM

## 2022-09-03 DIAGNOSIS — R0609 Other forms of dyspnea: Secondary | ICD-10-CM

## 2022-09-03 LAB — COMPREHENSIVE METABOLIC PANEL
ALT: 15 U/L (ref 0–35)
AST: 23 U/L (ref 0–37)
Albumin: 4.2 g/dL (ref 3.5–5.2)
Alkaline Phosphatase: 109 U/L (ref 39–117)
BUN: 13 mg/dL (ref 6–23)
CO2: 27 mEq/L (ref 19–32)
Calcium: 9.2 mg/dL (ref 8.4–10.5)
Chloride: 102 mEq/L (ref 96–112)
Creatinine, Ser: 0.94 mg/dL (ref 0.40–1.20)
GFR: 64.62 mL/min (ref >60.00)
Glucose, Bld: 90 mg/dL (ref 70–99)
Potassium: 4.1 mEq/L (ref 3.5–5.1)
Sodium: 136 mEq/L (ref 135–145)
Total Bilirubin: 0.3 mg/dL (ref 0.2–1.2)
Total Protein: 6.8 g/dL (ref 6.0–8.3)

## 2022-09-03 LAB — CBC
HCT: 32.2 % — ABNORMAL LOW (ref 36.0–46.0)
Hemoglobin: 10.5 g/dL — ABNORMAL LOW (ref 12.0–15.0)
MCHC: 32.7 g/dL (ref 30.0–36.0)
MCV: 82.2 fl (ref 78.0–100.0)
Platelets: 391 K/uL (ref 150.0–400.0)
RBC: 3.92 Mil/uL (ref 3.87–5.11)
RDW: 14.3 % (ref 11.5–15.5)
WBC: 7 K/uL (ref 4.0–10.5)

## 2022-09-03 MED ORDER — ALBUTEROL SULFATE HFA 108 (90 BASE) MCG/ACT IN AERS: 2.0000 | INHALATION_SPRAY | Freq: Four times a day (QID) | RESPIRATORY_TRACT | 0 refills | Status: DC | PRN

## 2022-09-03 NOTE — Progress Notes (Signed)
Chief Complaint  Patient presents with   Shortness of Breath    Subjective: Patient is a 61 y.o. female here for sob.  For the past year, the patient has had dyspnea on exertion.  Last year she was evaluated by cardiology and had an unremarkable Holter monitor and echocardiogram.  In recent weeks, she has been having worsening fatigue/shortness of breath on exertion and sometimes will have lightheadedness.  She has not passed out or lost consciousness.  She is eating and drinking normally.  No blood in her stool or urine.  She is not having any wheezing or shortness of breath.  No history of asthma or COPD.  Past Medical History:  Diagnosis Date   Allergic rhinitis 05/05/2012   Allergy    Anxiety 07/20/2021   Depressive disorder 07/20/2021   DOE (dyspnea on exertion) 12/28/2017   Essential hypertension 12/28/2017   Generalized anxiety disorder 07/20/2021   GERD (gastroesophageal reflux disease)    History of irritable bowel syndrome 07/20/2021   Insomnia 07/17/2021   Migraine headache 07/20/2021   Osteopenia    Osteoporosis 07/20/2021   Periumbilical pain 07/20/2021   Spastic colon 07/20/2021    Objective: BP 120/76 (BP Location: Left Arm, Patient Position: Sitting, Cuff Size: Normal)   Pulse 83   Temp 98.2 F (36.8 C) (Oral)   Ht 5' 4.5" (1.638 m)   Wt 166 lb (75.3 kg)   SpO2 99%   BMI 28.05 kg/m  General: Awake, appears stated age Heart: RRR, no LE edema Lungs: CTAB, no rales, wheezes or rhonchi. No accessory muscle use Neuro: DTRs equal and symmetric throughout, no clonus, no cerebellar signs, 5/5 strength throughout Mouth: MMM Psych: Age appropriate judgment and insight, normal affect and mood  Assessment and Plan: Dyspnea on exertion - Plan: CBC, Comprehensive metabolic panel, Pulmonary function test, albuterol (VENTOLIN HFA) 108 (90 Base) MCG/ACT inhaler  Screening for colon cancer - Plan: Ambulatory referral to Gastroenterology  Chronic, uncontrolled.  Trial  albuterol 10 to 15 minutes prior to physical exertion.  Check PFTs.  Check labs.  She will reach out to her cardiologist to see if the lightheadedness with exertion is anything of concern.  Follow-up pending the results of the PFTs. Refer to gastroenterology for her follow-up colonoscopy as her previous GI doctor has retired. The patient voiced understanding and agreement to the plan.  Jilda Roche [place], DO 09/03/22  12:16 PM

## 2022-09-03 NOTE — Patient Instructions (Addendum)
Please call Dr. Bing Matter and give him na update of your lightheadedness with exertion to see if he thinks it could be related to the heart. [phone].  Use the inhaler 10-15 min before planned physical exertion or as needed if we forgot.   Someone will reach out regarding your breathing test to schedule in the next week or so.  Give [place] 2-3 business days to get the results of your labs back.   Let us know if you need anything.

## 2022-09-03 NOTE — Telephone Encounter (Signed)
Pt states she has been getting SOB walking small distances, lightheaded and having an accelerated heart rate for the past two weeks. She decided to wait until Tuesday to see pcp but did transfer her to triage to talk with a nurse.

## 2022-09-03 NOTE — Telephone Encounter (Signed)
Have time today/or ER.

## 2022-09-06 ENCOUNTER — Other Ambulatory Visit: Payer: 59

## 2022-09-06 ENCOUNTER — Other Ambulatory Visit: Payer: Self-pay | Admitting: Family Medicine

## 2022-09-06 DIAGNOSIS — R0609 Other forms of dyspnea: Secondary | ICD-10-CM

## 2022-09-07 ENCOUNTER — Other Ambulatory Visit: Payer: Self-pay | Admitting: Family Medicine

## 2022-09-07 ENCOUNTER — Ambulatory Visit: Payer: 59 | Admitting: Family Medicine

## 2022-09-07 DIAGNOSIS — E611 Iron deficiency: Secondary | ICD-10-CM

## 2022-09-07 LAB — IRON,TIBC AND FERRITIN PANEL
%SAT: 6 % — ABNORMAL LOW (ref 16–45)
Ferritin: 5 ng/mL — ABNORMAL LOW (ref 16–288)
Iron: 28 ug/dL — ABNORMAL LOW (ref 45–160)
TIBC: 475 ug/dL — ABNORMAL HIGH (ref 250–450)

## 2022-09-10 ENCOUNTER — Other Ambulatory Visit (INDEPENDENT_AMBULATORY_CARE_PROVIDER_SITE_OTHER): Payer: 59

## 2022-09-10 DIAGNOSIS — R0609 Other forms of dyspnea: Secondary | ICD-10-CM

## 2022-09-10 DIAGNOSIS — E611 Iron deficiency: Secondary | ICD-10-CM

## 2022-09-10 LAB — URINALYSIS, MICROSCOPIC ONLY

## 2022-09-10 LAB — CBC
HCT: 32.6 % — ABNORMAL LOW (ref 36.0–46.0)
Hemoglobin: 10.6 g/dL — ABNORMAL LOW (ref 12.0–15.0)
MCHC: 32.7 g/dL (ref 30.0–36.0)
MCV: 81.2 fl (ref 78.0–100.0)
Platelets: 428 10*3/uL — ABNORMAL HIGH (ref 150.0–400.0)
RBC: 4.01 Mil/uL (ref 3.87–5.11)
RDW: 14.5 % (ref 11.5–15.5)
WBC: 7.1 10*3/uL (ref 4.0–10.5)

## 2022-09-30 ENCOUNTER — Other Ambulatory Visit: Payer: Self-pay | Admitting: Family Medicine

## 2022-09-30 DIAGNOSIS — R0609 Other forms of dyspnea: Secondary | ICD-10-CM

## 2022-11-04 ENCOUNTER — Ambulatory Visit: Payer: 59 | Admitting: Family

## 2022-11-04 VITALS — BP 102/62 | HR 90 | Ht 64.5 in | Wt 166.2 lb

## 2022-11-04 DIAGNOSIS — D649 Anemia, unspecified: Secondary | ICD-10-CM | POA: Diagnosis not present

## 2022-11-04 DIAGNOSIS — R0609 Other forms of dyspnea: Secondary | ICD-10-CM

## 2022-11-04 NOTE — Progress Notes (Signed)
Jasmine Wells is a 62 y.o. female with the following history as recorded in [employer]:  Patient Active Problem List   Diagnosis Date Noted   Palpitations 07/24/2021   Lightheadedness 07/24/2021   Osteopenia 07/20/2021   Allergy 07/20/2021   Depressive disorder 52/84/1324   Periumbilical pain 40/07/2724   Osteoporosis 07/20/2021   Migraine headache 07/20/2021   History of irritable bowel syndrome 07/20/2021   Generalized anxiety disorder 07/20/2021   Spastic colon 07/20/2021   Insomnia 07/17/2021   Dyspnea on exertion 12/28/2017   Essential hypertension 12/28/2017   GERD (gastroesophageal reflux disease) 06/20/2017   Allergic rhinitis 05/05/2012    Current Outpatient Medications  Medication Sig Dispense Refill   venlafaxine XR (EFFEXOR-XR) 150 MG 24 hr capsule Take 1 capsule (150 mg total) by mouth daily.     albuterol (VENTOLIN HFA) 108 (90 Base) MCG/ACT inhaler TAKE 2 PUFFS BY MOUTH EVERY 6 HOURS AS NEEDED FOR WHEEZE OR SHORTNESS OF BREATH (Patient not taking: Reported on 11/04/2022) 8.5 each 0   busPIRone (BUSPAR) 7.5 MG tablet TAKE 1 TABLET (7.5 MG TOTAL) BY MOUTH 2 (TWO) TIMES DAILY AS NEEDED (ANXIETY/PANIC). (Patient not taking: Reported on 11/04/2022) 180 tablet 1   famotidine (PEPCID) 20 MG tablet Take 20 mg by mouth daily. (Patient not taking: Reported on 11/04/2022)     metoprolol succinate (TOPROL-XL) 25 MG 24 hr tablet Take 12.5 mg by mouth daily. (Patient not taking: Reported on 11/04/2022)     No current facility-administered medications for this visit.    Allergies: Erythromycin, Prochlorperazine, Promethazine hcl, Reglan [metoclopramide], and Sulfa antibiotics  Past Medical History:  Diagnosis Date   Allergic rhinitis 05/05/2012   Allergy    Anxiety 07/20/2021   Depressive disorder 07/20/2021   DOE (dyspnea on exertion) 12/28/2017   Essential hypertension 12/28/2017   Generalized anxiety disorder 07/20/2021   GERD (gastroesophageal reflux disease)    History of irritable  bowel syndrome 07/20/2021   Insomnia 07/17/2021   Migraine headache 07/20/2021   Osteopenia    Osteoporosis 36/64/4034   Periumbilical pain 74/25/9563   Spastic colon 07/20/2021    Past Surgical History:  Procedure Laterality Date   CESAREAN SECTION     CHOLECYSTECTOMY     ERCP     UPPER GASTROINTESTINAL ENDOSCOPY      Family History  Problem Relation Age of Onset   Colon cancer Maternal Grandfather    Hypothyroidism Mother    Heart disease Father    Esophageal cancer Neg Hx    Rectal cancer Neg Hx    Stomach cancer Neg Hx     Social History   Tobacco Use   Smoking status: Never   Smokeless tobacco: Never  Substance Use Topics   Alcohol use: No    Subjective:   Accompanied by husband; notes she has been having persisting/ worsening problems with feeling light-headed/ short of breath on exertion for the last 1-2 years; last saw her PCP about these symptoms in December and GI referral was updated- unfortunately patient was not contacted to schedule; notes that symptoms have been problematic again in the past 2-3 days; did have cardiology work up in 2022 for similar symptoms which was normal; patient admits that stress level has been very high recently- 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  Head: Normocephalic and atraumatic  Eyes: Sclera and conjunctiva clear; pupils round and reactive to light; extraocular movements intact  Ears: External normal; canals clear; tympanic membranes normal  Oropharynx: Pink, supple. No suspicious lesions  Neck: Supple without thyromegaly, adenopathy  Lungs: Respirations unlabored; clear to auscultation bilaterally without wheeze, rales, rhonchi  CVS  exam: normal rate and regular rhythm.  Neurologic: Alert and oriented; speech intact; face symmetrical; in wheelchair today;   1. Dyspnea on exertion   2. Symptomatic anemia     Plan:  Discussed ER evaluation but patient is adamant that she does not want to go to ER today; update EKG today- NSR; will repeat CBC, CMP, ferritin/ TIBC, IBC today; emergent referrals updated to hematology and GI;  Strict ER precautions discussed;   No follow-ups on file.  Orders Placed This Encounter  Procedures   CBC with Differential/Platelet   Iron, TIBC and Ferritin Panel   Comp Met (CMET)   Ambulatory referral to Gastroenterology    Referral Priority:   Emergency    Referral Type:   Consultation    Referral Reason:   Specialty Services Required    Number of Visits Requested:   1   Ambulatory referral to Hematology / Oncology    Referral Priority:   Emergency    Referral Type:   Consultation    Referral Reason:   Specialty Services Required    Requested Specialty:   Oncology    Number of Visits Requested:   1   EKG 12-Lead    Requested Prescriptions    No prescriptions requested or ordered in this encounter

## 2022-11-05 ENCOUNTER — Inpatient Hospital Stay: Payer: 59

## 2022-11-05 ENCOUNTER — Inpatient Hospital Stay: Payer: 59 | Attending: Family | Admitting: Family

## 2022-11-05 VITALS — BP 120/78 | HR 109 | Temp 97.8°F | Resp 17 | Ht 64.5 in | Wt 166.0 lb

## 2022-11-05 VITALS — BP 127/69 | HR 72

## 2022-11-05 DIAGNOSIS — R5383 Other fatigue: Secondary | ICD-10-CM | POA: Insufficient documentation

## 2022-11-05 DIAGNOSIS — R0602 Shortness of breath: Secondary | ICD-10-CM | POA: Insufficient documentation

## 2022-11-05 DIAGNOSIS — R002 Palpitations: Secondary | ICD-10-CM | POA: Diagnosis not present

## 2022-11-05 DIAGNOSIS — R0609 Other forms of dyspnea: Secondary | ICD-10-CM | POA: Insufficient documentation

## 2022-11-05 DIAGNOSIS — R531 Weakness: Secondary | ICD-10-CM | POA: Diagnosis not present

## 2022-11-05 DIAGNOSIS — R61 Generalized hyperhidrosis: Secondary | ICD-10-CM | POA: Insufficient documentation

## 2022-11-05 DIAGNOSIS — Z79899 Other long term (current) drug therapy: Secondary | ICD-10-CM | POA: Insufficient documentation

## 2022-11-05 DIAGNOSIS — I10 Essential (primary) hypertension: Secondary | ICD-10-CM | POA: Diagnosis not present

## 2022-11-05 DIAGNOSIS — Z882 Allergy status to sulfonamides status: Secondary | ICD-10-CM | POA: Diagnosis not present

## 2022-11-05 DIAGNOSIS — D649 Anemia, unspecified: Secondary | ICD-10-CM

## 2022-11-05 DIAGNOSIS — K219 Gastro-esophageal reflux disease without esophagitis: Secondary | ICD-10-CM | POA: Diagnosis not present

## 2022-11-05 DIAGNOSIS — Z881 Allergy status to other antibiotic agents status: Secondary | ICD-10-CM | POA: Diagnosis not present

## 2022-11-05 DIAGNOSIS — Z9049 Acquired absence of other specified parts of digestive tract: Secondary | ICD-10-CM | POA: Insufficient documentation

## 2022-11-05 DIAGNOSIS — R42 Dizziness and giddiness: Secondary | ICD-10-CM | POA: Diagnosis not present

## 2022-11-05 DIAGNOSIS — Z8249 Family history of ischemic heart disease and other diseases of the circulatory system: Secondary | ICD-10-CM | POA: Diagnosis not present

## 2022-11-05 DIAGNOSIS — Z888 Allergy status to other drugs, medicaments and biological substances status: Secondary | ICD-10-CM | POA: Diagnosis not present

## 2022-11-05 DIAGNOSIS — Z800 Family history of malignant neoplasm of digestive organs: Secondary | ICD-10-CM | POA: Insufficient documentation

## 2022-11-05 DIAGNOSIS — Z8349 Family history of other endocrine, nutritional and metabolic diseases: Secondary | ICD-10-CM | POA: Diagnosis not present

## 2022-11-05 DIAGNOSIS — D509 Iron deficiency anemia, unspecified: Secondary | ICD-10-CM

## 2022-11-05 LAB — CBC WITH DIFFERENTIAL/PLATELET
Basophils Absolute: 0.1 10*3/uL (ref 0.0–0.1)
Basophils Relative: 0.7 % (ref 0.0–3.0)
Eosinophils Absolute: 0.2 10*3/uL (ref 0.0–0.7)
Eosinophils Relative: 2.1 % (ref 0.0–5.0)
HCT: 29.9 % — ABNORMAL LOW (ref 36.0–46.0)
Hemoglobin: 9.4 g/dL — ABNORMAL LOW (ref 12.0–15.0)
Lymphocytes Relative: 29.8 % (ref 12.0–46.0)
Lymphs Abs: 3.5 10*3/uL (ref 0.7–4.0)
MCHC: 31.4 g/dL (ref 30.0–36.0)
MCV: 78.1 fl (ref 78.0–100.0)
Monocytes Absolute: 0.9 10*3/uL (ref 0.1–1.0)
Monocytes Relative: 7.4 % (ref 3.0–12.0)
Neutro Abs: 7 10*3/uL (ref 1.4–7.7)
Neutrophils Relative %: 60 % (ref 43.0–77.0)
Platelets: 499 10*3/uL — ABNORMAL HIGH (ref 150.0–400.0)
RBC: 3.83 Mil/uL — ABNORMAL LOW (ref 3.87–5.11)
RDW: 15.4 % (ref 11.5–15.5)
WBC: 11.7 10*3/uL — ABNORMAL HIGH (ref 4.0–10.5)

## 2022-11-05 LAB — COMPREHENSIVE METABOLIC PANEL
ALT: 12 U/L (ref 0–35)
AST: 20 U/L (ref 0–37)
Albumin: 4.1 g/dL (ref 3.5–5.2)
Alkaline Phosphatase: 93 U/L (ref 39–117)
BUN: 23 mg/dL (ref 6–23)
CO2: 24 mEq/L (ref 19–32)
Calcium: 9.5 mg/dL (ref 8.4–10.5)
Chloride: 103 mEq/L (ref 96–112)
Creatinine, Ser: 0.88 mg/dL (ref 0.40–1.20)
GFR: 69.86 mL/min (ref >60.00)
Glucose, Bld: 100 mg/dL — ABNORMAL HIGH (ref 70–99)
Potassium: 4.3 mEq/L (ref 3.5–5.1)
Sodium: 136 mEq/L (ref 135–145)
Total Bilirubin: 0.3 mg/dL (ref 0.2–1.2)
Total Protein: 7 g/dL (ref 6.0–8.3)

## 2022-11-05 LAB — IRON,TIBC AND FERRITIN PANEL
%SAT: 4 % (calc) — ABNORMAL LOW (ref 16–45)
Ferritin: 6 ng/mL — ABNORMAL LOW (ref 16–288)
Iron: 18 ug/dL — ABNORMAL LOW (ref 45–160)
TIBC: 499 mcg/dL (calc) — ABNORMAL HIGH (ref 250–450)

## 2022-11-05 MED ORDER — SODIUM CHLORIDE 0.9 % IV SOLN
300.0000 mg | Freq: Once | INTRAVENOUS | Status: AC
  Administered 2022-11-05: 300 mg via INTRAVENOUS
  Filled 2022-11-05: qty 300

## 2022-11-05 MED ORDER — SODIUM CHLORIDE 0.9 % IV SOLN: Freq: Once | INTRAVENOUS | Status: AC

## 2022-11-05 NOTE — Progress Notes (Signed)
Hematology/Oncology Consultation   Name: Jasmine Wells      MRN: [id number]    Location: Room/bed info not found  Date: 11/05/2022 Time:9:28 AM   REFERRING PHYSICIAN: Sherlene Shams, FNP  REASON FOR CONSULT:  Dyspnea on exertion, symptomatic anemia    DIAGNOSIS: Iron deficiency anemia   HISTORY OF PRESENT ILLNESS: Jasmine Wells is a very pleasant 62 yo caucasian female with recent diagnosis of iron deficiency anemia.  Iron saturation yesterday was down to  4% and ferritin 6. Hgb is pending.  She is symptomatic with fatigue, weakness, SOB with exertion, dizziness, diaphoresis and palpitations.  She has not noted any obvious blood loss. No abnormal bruising or petechiae.  She states that her maternal grandmother had history of anemia requiring transfusional support. She is unsure of the cause.  Her last colonoscopy was 10 years ago with [facility]. She states that this was negative. She has been referred back to GI for further eval for possible cause of anemia.  She states that her mammogram is also up to date and most recent was negative.  No fever, chills, n/v, cough, rash, chest pain or changes in bladder habits.  She has been taking an oral iron supplement for the last month.  She has some constipation and abdominal discomfort at times.  No history of diabetes or thyroid disease.  No personal or known history of cancer.  No swelling, tenderness, numbness or tingling in her extremities.  No falls or syncope.  Appetite and hydration are good. Weight is stable at 166 lbs.  No smoking, ETOH or recreational drug use.  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   ROS: All other 10 point review of systems is negative.   PAST MEDICAL HISTORY:   Past Medical History:  Diagnosis Date   Allergic rhinitis 05/05/2012   Allergy    Anxiety 07/20/2021   Depressive disorder 07/20/2021   DOE (dyspnea on exertion) 12/28/2017   Essential hypertension 12/28/2017    Generalized anxiety disorder 07/20/2021   GERD (gastroesophageal reflux disease)    History of irritable bowel syndrome 07/20/2021   Insomnia 07/17/2021   Migraine headache 07/20/2021   Osteopenia    Osteoporosis 51/76/1607   Periumbilical pain 37/07/6268   Spastic colon 07/20/2021    ALLERGIES: Allergies  Allergen Reactions   Erythromycin     unsure   Prochlorperazine Other (See Comments)    shaking shaking    Promethazine Hcl     shaking   Reglan [Metoclopramide]     "shaking"   Sulfa Antibiotics Hives      MEDICATIONS:  Current Outpatient Medications on File Prior to Visit  Medication Sig Dispense Refill   albuterol (VENTOLIN HFA) 108 (90 Base) MCG/ACT inhaler TAKE 2 PUFFS BY MOUTH EVERY 6 HOURS AS NEEDED FOR WHEEZE OR SHORTNESS OF BREATH (Patient not taking: Reported on 11/04/2022) 8.5 each 0   busPIRone (BUSPAR) 7.5 MG tablet TAKE 1 TABLET (7.5 MG TOTAL) BY MOUTH 2 (TWO) TIMES DAILY AS NEEDED (ANXIETY/PANIC). (Patient not taking: Reported on 11/04/2022) 180 tablet 1   famotidine (PEPCID) 20 MG tablet Take 20 mg by mouth daily. (Patient not taking: Reported on 11/04/2022)     metoprolol succinate (TOPROL-XL) 25 MG 24 hr tablet Take 12.5 mg by mouth daily. (Patient not taking: Reported on 11/04/2022)     venlafaxine XR (EFFEXOR-XR) 150 MG 24 hr capsule Take 1 capsule (150 mg total) by mouth daily.     No current facility-administered  medications on file prior to visit.     PAST SURGICAL HISTORY Past Surgical History:  Procedure Laterality Date   CESAREAN SECTION     CHOLECYSTECTOMY     ERCP     UPPER GASTROINTESTINAL ENDOSCOPY      FAMILY HISTORY: Family History  Problem Relation Age of Onset   Colon cancer Maternal Grandfather    Hypothyroidism Mother    Heart disease Father    Esophageal cancer Neg Hx    Rectal cancer Neg Hx    Stomach cancer Neg Hx     SOCIAL HISTORY:  reports that she has never smoked. She has never used smokeless tobacco. She reports that  she does not drink alcohol and does not use drugs.  PERFORMANCE STATUS: The patient's performance status is 1 - Symptomatic but completely ambulatory  PHYSICAL EXAM: Most Recent Vital Signs: There were no vitals taken for this visit. There were no vitals taken for this visit.  General Appearance:    Alert, cooperative, no distress, appears stated age  Head:    Normocephalic, without obvious abnormality, atraumatic  Eyes:    PERRL, conjunctiva/corneas clear, EOM's intact, fundi    benign, both eyes        Throat:   Lips, mucosa, and tongue normal; teeth and gums normal  Neck:   Supple, symmetrical, trachea midline, no adenopathy;    thyroid:  no enlargement/tenderness/nodules; no carotid   bruit or JVD  Back:     Symmetric, no curvature, ROM normal, no CVA tenderness  Lungs:     Clear to auscultation bilaterally, respirations unlabored  Chest Wall:    No tenderness or deformity   Heart:    Regular rate and rhythm, S1 and S2 normal, no murmur, rub   or gallop     Abdomen:     Soft, non-tender, bowel sounds active all four quadrants,    no masses, no organomegaly        Extremities:   Extremities normal, atraumatic, no cyanosis or edema  Pulses:   2+ and symmetric all extremities  Skin:   Skin color, texture, turgor normal, no rashes or lesions  Lymph nodes:   Cervical, supraclavicular, and axillary nodes normal  Neurologic:   CNII-XII intact, normal strength, sensation and reflexes    throughout    LABORATORY DATA:  Results for orders placed or performed in visit on 11/04/22 (from the past 48 hour(s))  Iron, TIBC and Ferritin Panel     Status: Abnormal   Collection Time: 11/04/22  4:18 PM  Result Value Ref Range   Iron 18 (L) 45 - 160 mcg/dL   TIBC 499 (H) 250 - 450 mcg/dL (calc)   %SAT 4 (L) 16 - 45 % (calc)   Ferritin 6 (L) 16 - 288 ng/mL      RADIOGRAPHY: No results found.     PATHOLOGY: None   ASSESSMENT/PLAN: Jasmine Wells is a very pleasant 62 yo caucasian female  with recent diagnosis of iron deficiency anemia.  We will get her set up for 3 doses of Venofer.  She was also given supplies to bring in stool specimens to test for occult blood.  Follow-up in 1 month.   All questions were answered. The patient knows to call the clinic with any problems, questions or concerns. We can certainly see the patient much sooner if necessary.  The patient was discussed with Dr. Marin Olp and he is in agreement with the aforementioned.   Lottie Dawson, NP

## 2022-11-05 NOTE — Patient Instructions (Signed)

## 2022-11-09 ENCOUNTER — Telehealth: Payer: Self-pay | Admitting: Family Medicine

## 2022-11-09 NOTE — Telephone Encounter (Signed)
Labs have been faxed.

## 2022-11-09 NOTE — Telephone Encounter (Signed)
Patient called to request labs from 11/04/22 to be faxed to [facility] at [phone] (ATTN: Barrie Lyme)

## 2022-11-12 ENCOUNTER — Inpatient Hospital Stay: Payer: 59

## 2022-11-12 VITALS — BP 130/73 | HR 78 | Temp 97.8°F | Resp 18

## 2022-11-12 DIAGNOSIS — D509 Iron deficiency anemia, unspecified: Secondary | ICD-10-CM

## 2022-11-12 MED ORDER — SODIUM CHLORIDE 0.9 % IV SOLN
300.0000 mg | Freq: Once | INTRAVENOUS | Status: AC
  Administered 2022-11-12: 300 mg via INTRAVENOUS
  Filled 2022-11-12: qty 300

## 2022-11-12 MED ORDER — SODIUM CHLORIDE 0.9 % IV SOLN: Freq: Once | INTRAVENOUS | Status: AC

## 2022-11-12 NOTE — Patient Instructions (Signed)

## 2022-11-19 ENCOUNTER — Inpatient Hospital Stay: Payer: 59

## 2022-11-19 VITALS — BP 114/81 | HR 70 | Temp 98.0°F | Resp 16

## 2022-11-19 DIAGNOSIS — D509 Iron deficiency anemia, unspecified: Secondary | ICD-10-CM

## 2022-11-19 MED ORDER — SODIUM CHLORIDE 0.9 % IV SOLN
300.0000 mg | Freq: Once | INTRAVENOUS | Status: AC
  Administered 2022-11-19: 300 mg via INTRAVENOUS
  Filled 2022-11-19: qty 300

## 2022-11-19 MED ORDER — SODIUM CHLORIDE 0.9 % IV SOLN: Freq: Once | INTRAVENOUS | Status: AC

## 2022-11-19 NOTE — Patient Instructions (Signed)

## 2022-12-03 ENCOUNTER — Inpatient Hospital Stay: Payer: 59

## 2022-12-03 ENCOUNTER — Inpatient Hospital Stay: Payer: 59 | Admitting: Family

## 2022-12-17 ENCOUNTER — Encounter: Payer: Self-pay | Admitting: Family

## 2022-12-17 ENCOUNTER — Inpatient Hospital Stay: Payer: 59 | Attending: Hematology & Oncology

## 2022-12-17 ENCOUNTER — Inpatient Hospital Stay: Payer: 59 | Admitting: Family

## 2022-12-17 VITALS — BP 141/89 | HR 99 | Temp 98.7°F | Resp 17 | Wt 167.8 lb

## 2022-12-17 DIAGNOSIS — Z882 Allergy status to sulfonamides status: Secondary | ICD-10-CM | POA: Diagnosis not present

## 2022-12-17 DIAGNOSIS — R0609 Other forms of dyspnea: Secondary | ICD-10-CM | POA: Insufficient documentation

## 2022-12-17 DIAGNOSIS — Z79899 Other long term (current) drug therapy: Secondary | ICD-10-CM | POA: Insufficient documentation

## 2022-12-17 DIAGNOSIS — D509 Iron deficiency anemia, unspecified: Secondary | ICD-10-CM

## 2022-12-17 DIAGNOSIS — D649 Anemia, unspecified: Secondary | ICD-10-CM

## 2022-12-17 DIAGNOSIS — Z881 Allergy status to other antibiotic agents status: Secondary | ICD-10-CM | POA: Diagnosis not present

## 2022-12-17 DIAGNOSIS — Z888 Allergy status to other drugs, medicaments and biological substances status: Secondary | ICD-10-CM | POA: Insufficient documentation

## 2022-12-17 LAB — CBC WITH DIFFERENTIAL (CANCER CENTER ONLY)
Abs Immature Granulocytes: 0.08 10*3/uL — ABNORMAL HIGH (ref 0.00–0.07)
Basophils Absolute: 0.1 10*3/uL (ref 0.0–0.1)
Basophils Relative: 1 %
Eosinophils Absolute: 0.2 10*3/uL (ref 0.0–0.5)
Eosinophils Relative: 2 %
HCT: 42.3 % (ref 36.0–46.0)
Hemoglobin: 13.2 g/dL (ref 12.0–15.0)
Immature Granulocytes: 1 %
Lymphocytes Relative: 27 %
Lymphs Abs: 2.4 10*3/uL (ref 0.7–4.0)
MCH: 27.2 pg (ref 26.0–34.0)
MCHC: 31.2 g/dL (ref 30.0–36.0)
MCV: 87.2 fL (ref 80.0–100.0)
Monocytes Absolute: 0.6 10*3/uL (ref 0.1–1.0)
Monocytes Relative: 7 %
Neutro Abs: 5.5 10*3/uL (ref 1.7–7.7)
Neutrophils Relative %: 62 %
Platelet Count: 359 10*3/uL (ref 150–400)
RBC: 4.85 MIL/uL (ref 3.87–5.11)
RDW: 19.3 % — ABNORMAL HIGH (ref 11.5–15.5)
WBC Count: 8.7 10*3/uL (ref 4.0–10.5)
nRBC: 0 % (ref 0.0–0.2)

## 2022-12-17 LAB — CMP (CANCER CENTER ONLY)
ALT: 20 U/L (ref 0–44)
AST: 25 U/L (ref 15–41)
Albumin: 4.7 g/dL (ref 3.5–5.0)
Alkaline Phosphatase: 104 U/L (ref 38–126)
Anion gap: 10 (ref 5–15)
BUN: 14 mg/dL (ref 8–23)
CO2: 26 mmol/L (ref 22–32)
Calcium: 10 mg/dL (ref 8.9–10.3)
Chloride: 106 mmol/L (ref 98–111)
Creatinine: 1.01 mg/dL — ABNORMAL HIGH (ref 0.44–1.00)
GFR, Estimated: >60 mL/min (ref >60)
Glucose, Bld: 100 mg/dL — ABNORMAL HIGH (ref 70–99)
Potassium: 3.9 mmol/L (ref 3.5–5.1)
Sodium: 142 mmol/L (ref 135–145)
Total Bilirubin: 0.4 mg/dL (ref 0.3–1.2)
Total Protein: 7.6 g/dL (ref 6.5–8.1)

## 2022-12-17 LAB — SAVE SMEAR(SSMR), FOR PROVIDER SLIDE REVIEW

## 2022-12-17 LAB — RETICULOCYTES
Immature Retic Fract: 11.6 % (ref 2.3–15.9)
RBC.: 4.87 MIL/uL (ref 3.87–5.11)
Retic Count, Absolute: 66.2 10*3/uL (ref 19.0–186.0)
Retic Ct Pct: 1.4 % (ref 0.4–3.1)

## 2022-12-17 LAB — FERRITIN: Ferritin: 53 ng/mL (ref 11–307)

## 2022-12-17 LAB — LACTATE DEHYDROGENASE: LDH: 183 U/L (ref 98–192)

## 2022-12-17 NOTE — Progress Notes (Signed)
Hematology and Oncology Follow Up Visit  JESSICAMARIE TWEET CZ:[id number] [birth year] 62 y.o. 12/17/2022   Principle Diagnosis:  Iron deficiency anemia   Current Therapy:   IV iron as indicated    Interim History:  Ms. Tolerico is here today with her husband for follow-up. She is doing well and has no complaints at this time. She notes that she is feeling much better since receiving IV iron. Hgb is now up to 13.2.  No blood loss noted. No bruising or petechiae.  No fever, chills, n/v, cough, rash, dizziness, SOB, chest pain, palpitations, abdominal pain or changes in bowel or bladder habits.  No swelling, tenderness, numbness or tingling in her extremities at this time.  No falls or syncope.  Weight is stable at 167 lbs. Appetite and hydration are good.   ECOG Performance Status: 1 - Symptomatic but completely ambulatory  Medications:  Allergies as of 12/17/2022       Reactions   Erythromycin    unsure   Prochlorperazine Other (See Comments)   shaking shaking   Promethazine Hcl    shaking   Reglan [metoclopramide]    "shaking"   Sulfa Antibiotics Hives        Medication List        Accurate as of December 17, 2022  2:35 PM. If you have any questions, ask your nurse or doctor.          albuterol 108 (90 Base) MCG/ACT inhaler Commonly known as: VENTOLIN HFA TAKE 2 PUFFS BY MOUTH EVERY 6 HOURS AS NEEDED FOR WHEEZE OR SHORTNESS OF BREATH   busPIRone 7.5 MG tablet Commonly known as: BUSPAR TAKE 1 TABLET (7.5 MG TOTAL) BY MOUTH 2 (TWO) TIMES DAILY AS NEEDED (ANXIETY/PANIC).   famotidine 20 MG tablet Commonly known as: PEPCID Take 20 mg by mouth daily.   metoprolol succinate 25 MG 24 hr tablet Commonly known as: TOPROL-XL Take 12.5 mg by mouth daily.   venlafaxine XR 150 MG 24 hr capsule Commonly known as: EFFEXOR-XR Take 1 capsule (150 mg total) by mouth daily.        Allergies:  Allergies  Allergen Reactions   Erythromycin     unsure   Prochlorperazine  Other (See Comments)    shaking shaking    Promethazine Hcl     shaking   Reglan [Metoclopramide]     "shaking"   Sulfa Antibiotics Hives    Past Medical History, Surgical history, Social history, and Family History were reviewed and updated.  Review of Systems: All other 10 point review of systems is negative.   Physical Exam:  weight is 167 lb 12.8 oz (76.1 kg). Her oral temperature is 98.7 F (37.1 C). Her blood pressure is 141/89 (abnormal) and her pulse is 99. Her respiration is 17 and oxygen saturation is 98%.   Wt Readings from Last 3 Encounters:  12/17/22 167 lb 12.8 oz (76.1 kg)  11/05/22 166 lb (75.3 kg)  11/04/22 166 lb 3.2 oz (75.4 kg)    Ocular: Sclerae unicteric, pupils equal, round and reactive to light Ear-nose-throat: Oropharynx clear, dentition fair Lymphatic: No cervical or supraclavicular adenopathy Lungs no rales or rhonchi, good excursion bilaterally Heart regular rate and rhythm, no murmur appreciated Abd soft, nontender, positive bowel sounds MSK no focal spinal tenderness, no joint edema Neuro: non-focal, well-oriented, appropriate affect Breasts: Deferred   Lab Results  Component Value Date   WBC 8.7 12/17/2022   HGB 13.2 12/17/2022   HCT 42.3 12/17/2022   MCV 87.2  12/17/2022   PLT 359 12/17/2022   Lab Results  Component Value Date   FERRITIN 6 (L) 11/04/2022   IRON 18 (L) 11/04/2022   TIBC 499 (H) 11/04/2022   IRONPCTSAT 4 (L) 11/04/2022   Lab Results  Component Value Date   RETICCTPCT 1.4 12/17/2022   RBC 4.87 12/17/2022   RBC 4.85 12/17/2022   No results found for: "KPAFRELGTCHN", "LAMBDASER", "KAPLAMBRATIO" No results found for: "IGGSERUM", "IGA", "IGMSERUM" No results found for: "TOTALPROTELP", "ALBUMINELP", "A1GS", "A2GS", "BETS", "BETA2SER", "GAMS", "MSPIKE", "SPEI"   Chemistry      Component Value Date/Time   NA 142 12/17/2022 1333   K 3.9 12/17/2022 1333   CL 106 12/17/2022 1333   CO2 26 12/17/2022 1333   BUN 14  12/17/2022 1333   CREATININE 1.01 (H) 12/17/2022 1333      Component Value Date/Time   CALCIUM 10.0 12/17/2022 1333   ALKPHOS 104 12/17/2022 1333   AST 25 12/17/2022 1333   ALT 20 12/17/2022 1333   BILITOT 0.4 12/17/2022 1333       Impression and Plan: Ms. Jayson is a very pleasant 62 yo caucasian female with recent diagnosis of iron deficiency anemia.  Iron studies are pending. We will replace if needed.  Follow-up in 3 months.    Lottie Dawson, NP 3/15/20242:35 PM

## 2022-12-19 LAB — ERYTHROPOIETIN: Erythropoietin: 15.5 m[IU]/mL (ref 2.6–18.5)

## 2022-12-20 ENCOUNTER — Other Ambulatory Visit: Payer: Self-pay | Admitting: Family

## 2022-12-20 LAB — IRON AND IRON BINDING CAPACITY (CC-WL,HP ONLY)
Iron: 61 ug/dL (ref 28–170)
Saturation Ratios: 13 % (ref 10.4–31.8)
TIBC: 475 ug/dL — ABNORMAL HIGH (ref 250–450)
UIBC: 414 ug/dL (ref 148–442)

## 2023-03-18 ENCOUNTER — Inpatient Hospital Stay (HOSPITAL_BASED_OUTPATIENT_CLINIC_OR_DEPARTMENT_OTHER): Payer: 59 | Admitting: Family

## 2023-03-18 ENCOUNTER — Encounter: Payer: Self-pay | Admitting: Family

## 2023-03-18 ENCOUNTER — Inpatient Hospital Stay: Payer: 59 | Attending: Hematology & Oncology

## 2023-03-18 VITALS — BP 112/72 | HR 87 | Temp 98.0°F | Resp 17 | Wt 166.0 lb

## 2023-03-18 DIAGNOSIS — R002 Palpitations: Secondary | ICD-10-CM | POA: Diagnosis not present

## 2023-03-18 DIAGNOSIS — Z79899 Other long term (current) drug therapy: Secondary | ICD-10-CM | POA: Insufficient documentation

## 2023-03-18 DIAGNOSIS — R0602 Shortness of breath: Secondary | ICD-10-CM | POA: Diagnosis not present

## 2023-03-18 DIAGNOSIS — D509 Iron deficiency anemia, unspecified: Secondary | ICD-10-CM | POA: Insufficient documentation

## 2023-03-18 DIAGNOSIS — Z881 Allergy status to other antibiotic agents status: Secondary | ICD-10-CM | POA: Insufficient documentation

## 2023-03-18 DIAGNOSIS — Z882 Allergy status to sulfonamides status: Secondary | ICD-10-CM | POA: Diagnosis not present

## 2023-03-18 DIAGNOSIS — Z888 Allergy status to other drugs, medicaments and biological substances status: Secondary | ICD-10-CM | POA: Insufficient documentation

## 2023-03-18 LAB — CBC WITH DIFFERENTIAL (CANCER CENTER ONLY)
Abs Immature Granulocytes: 0.09 10*3/uL — ABNORMAL HIGH (ref 0.00–0.07)
Basophils Absolute: 0.1 10*3/uL (ref 0.0–0.1)
Basophils Relative: 1 %
Eosinophils Absolute: 0.2 10*3/uL (ref 0.0–0.5)
Eosinophils Relative: 2 %
HCT: 42.9 % (ref 36.0–46.0)
Hemoglobin: 13.8 g/dL (ref 12.0–15.0)
Immature Granulocytes: 1 %
Lymphocytes Relative: 31 %
Lymphs Abs: 2.5 10*3/uL (ref 0.7–4.0)
MCH: 28.8 pg (ref 26.0–34.0)
MCHC: 32.2 g/dL (ref 30.0–36.0)
MCV: 89.6 fL (ref 80.0–100.0)
Monocytes Absolute: 0.7 10*3/uL (ref 0.1–1.0)
Monocytes Relative: 9 %
Neutro Abs: 4.5 10*3/uL (ref 1.7–7.7)
Neutrophils Relative %: 56 %
Platelet Count: 354 10*3/uL (ref 150–400)
RBC: 4.79 MIL/uL (ref 3.87–5.11)
RDW: 14.1 % (ref 11.5–15.5)
WBC Count: 8.1 10*3/uL (ref 4.0–10.5)
nRBC: 0 % (ref 0.0–0.2)

## 2023-03-18 LAB — FERRITIN: Ferritin: 10 ng/mL — ABNORMAL LOW (ref 11–307)

## 2023-03-18 LAB — IRON AND IRON BINDING CAPACITY (CC-WL,HP ONLY)
Iron: 120 ug/dL (ref 28–170)
Saturation Ratios: 25 % (ref 10.4–31.8)
TIBC: 490 ug/dL — ABNORMAL HIGH (ref 250–450)
UIBC: 370 ug/dL (ref 148–442)

## 2023-03-18 LAB — RETICULOCYTES
Immature Retic Fract: 11.2 % (ref 2.3–15.9)
RBC.: 4.78 MIL/uL (ref 3.87–5.11)
Retic Count, Absolute: 73.1 10*3/uL (ref 19.0–186.0)
Retic Ct Pct: 1.5 % (ref 0.4–3.1)

## 2023-03-18 NOTE — Progress Notes (Signed)
Hematology and Oncology Follow Up Visit  Jasmine Wells [id number] 18-Feb-1961 62 y.o. 03/18/2023   Principle Diagnosis:  Iron deficiency anemia    Current Therapy:        IV iron as indicated    Interim History:  Jasmine Wells is here today for follow-up. She is doing well but did [place] episodes [place] the [place] where she had palpitations and SOB and had to take a break.  No falls or syncope. She has had no other episodes since being home.  No fever, chills, n/v, cough, rash, dizziness, SOB, chest pain, palpitations, abdominal pain or changes in bowel or bladder habits at this time.  No swelling, tenderness, numbness or tingling in her extremities.  Appetite and hydration are good. Weight is stable at 166 lbs.   ECOG Performance Status: 1 - Symptomatic but completely ambulatory  Medications:  Allergies as of 03/18/2023       Reactions   Erythromycin    unsure   Prochlorperazine Other (See Comments)   shaking shaking   Promethazine Hcl    shaking   Reglan [metoclopramide]    "shaking"   Sulfa Antibiotics Hives        Medication List        Accurate as of March 18, 2023  2:10 PM. If you have any questions, ask your nurse or doctor.          albuterol 108 (90 Base) MCG/ACT inhaler Commonly known as: VENTOLIN HFA TAKE 2 PUFFS BY MOUTH EVERY 6 HOURS AS NEEDED FOR WHEEZE OR SHORTNESS OF BREATH   busPIRone 7.5 MG tablet Commonly known as: BUSPAR TAKE 1 TABLET (7.5 MG TOTAL) BY MOUTH 2 (TWO) TIMES DAILY AS NEEDED (ANXIETY/PANIC).   famotidine 20 MG tablet Commonly known as: PEPCID Take 20 mg by mouth daily.   metoprolol succinate 25 MG 24 hr tablet Commonly known as: TOPROL-XL Take 12.5 mg by mouth daily.   venlafaxine XR 150 MG 24 hr capsule Commonly known as: EFFEXOR-XR Take 1 capsule (150 mg total) by mouth daily.        Allergies:  Allergies  Allergen Reactions   Erythromycin     unsure   Prochlorperazine Other (See Comments)     shaking shaking    Promethazine Hcl     shaking   Reglan [Metoclopramide]     "shaking"   Sulfa Antibiotics Hives    Past Medical History, Surgical history, Social history, and Family History were reviewed and updated.  Review of Systems: All other 10 point review of systems is negative.   Physical Exam:  weight is 166 lb (75.3 kg). Her oral temperature is 98 F (36.7 C). Her blood pressure is 112/72 and her pulse is 87. Her respiration is 17 and oxygen saturation is 100%.   Wt Readings from Last 3 Encounters:  03/18/23 166 lb (75.3 kg)  12/17/22 167 lb 12.8 oz (76.1 kg)  11/05/22 166 lb (75.3 kg)    Ocular: Sclerae unicteric, pupils equal, round and reactive to light Ear-nose-throat: Oropharynx clear, dentition fair Lymphatic: No cervical or supraclavicular adenopathy Lungs no rales or rhonchi, good excursion bilaterally Heart regular rate and rhythm, no murmur appreciated Abd soft, nontender, positive bowel sounds MSK no focal spinal tenderness, no joint edema Neuro: non-focal, well-oriented, appropriate affect Breasts: Deferred   Lab Results  Component Value Date   WBC 8.1 03/18/2023   HGB 13.8 03/18/2023   HCT 42.9 03/18/2023   MCV 89.6 03/18/2023   PLT  354 03/18/2023   Lab Results  Component Value Date   FERRITIN 53 12/17/2022   IRON 61 12/17/2022   TIBC 475 (H) 12/17/2022   UIBC 414 12/17/2022   IRONPCTSAT 13 12/17/2022   Lab Results  Component Value Date   RETICCTPCT 1.5 03/18/2023   RBC 4.79 03/18/2023   RBC 4.78 03/18/2023   No results found for: "KPAFRELGTCHN", "LAMBDASER", "KAPLAMBRATIO" No results found for: "IGGSERUM", "IGA", "IGMSERUM" No results found for: "TOTALPROTELP", "ALBUMINELP", "A1GS", "A2GS", "BETS", "BETA2SER", "GAMS", "MSPIKE", "SPEI"   Chemistry      Component Value Date/Time   NA 142 12/17/2022 1333   K 3.9 12/17/2022 1333   CL 106 12/17/2022 1333   CO2 26 12/17/2022 1333   BUN 14 12/17/2022 1333   CREATININE 1.01 (H)  12/17/2022 1333      Component Value Date/Time   CALCIUM 10.0 12/17/2022 1333   ALKPHOS 104 12/17/2022 1333   AST 25 12/17/2022 1333   ALT 20 12/17/2022 1333   BILITOT 0.4 12/17/2022 1333       Impression and Plan: Jasmine Wells is a very pleasant 62 yo caucasian female with recent diagnosis of iron deficiency anemia.  Iron studies are pending. We will replace if needed.  Follow-up in 4 months.     Eileen Stanford, NP 6/14/20242:10 PM

## 2023-03-22 ENCOUNTER — Inpatient Hospital Stay: Payer: 59

## 2023-03-22 VITALS — BP 137/79 | HR 71 | Temp 97.8°F | Resp 18

## 2023-03-22 DIAGNOSIS — D509 Iron deficiency anemia, unspecified: Secondary | ICD-10-CM

## 2023-03-22 MED ORDER — SODIUM CHLORIDE 0.9 % IV SOLN
300.0000 mg | Freq: Once | INTRAVENOUS | Status: AC
  Administered 2023-03-22: 300 mg via INTRAVENOUS
  Filled 2023-03-22: qty 300

## 2023-03-22 MED ORDER — SODIUM CHLORIDE 0.9 % IV SOLN: Freq: Once | INTRAVENOUS | Status: AC

## 2023-03-22 NOTE — Patient Instructions (Signed)

## 2023-03-29 ENCOUNTER — Inpatient Hospital Stay: Payer: 59

## 2023-03-29 VITALS — BP 138/85 | HR 73 | Temp 97.9°F | Resp 20

## 2023-03-29 DIAGNOSIS — D509 Iron deficiency anemia, unspecified: Secondary | ICD-10-CM | POA: Diagnosis not present

## 2023-03-29 MED ORDER — SODIUM CHLORIDE 0.9 % IV SOLN
300.0000 mg | Freq: Once | INTRAVENOUS | Status: AC
  Administered 2023-03-29: 300 mg via INTRAVENOUS
  Filled 2023-03-29: qty 300

## 2023-03-29 MED ORDER — SODIUM CHLORIDE 0.9 % IV SOLN: Freq: Once | INTRAVENOUS | Status: AC

## 2023-03-29 NOTE — Patient Instructions (Signed)

## 2023-04-12 ENCOUNTER — Inpatient Hospital Stay: Payer: 59 | Attending: Hematology & Oncology

## 2023-04-12 VITALS — BP 136/89 | HR 70 | Resp 20

## 2023-04-12 DIAGNOSIS — D509 Iron deficiency anemia, unspecified: Secondary | ICD-10-CM | POA: Insufficient documentation

## 2023-04-12 DIAGNOSIS — Z79899 Other long term (current) drug therapy: Secondary | ICD-10-CM | POA: Insufficient documentation

## 2023-04-12 MED ORDER — SODIUM CHLORIDE 0.9 % IV SOLN: Freq: Once | INTRAVENOUS | Status: AC

## 2023-04-12 MED ORDER — SODIUM CHLORIDE 0.9 % IV SOLN
300.0000 mg | Freq: Once | INTRAVENOUS | Status: AC
  Administered 2023-04-12: 300 mg via INTRAVENOUS
  Filled 2023-04-12: qty 300

## 2023-04-12 NOTE — Patient Instructions (Signed)

## 2023-05-04 LAB — HM DEXA SCAN

## 2023-07-13 ENCOUNTER — Other Ambulatory Visit: Payer: Self-pay

## 2023-07-13 DIAGNOSIS — D509 Iron deficiency anemia, unspecified: Secondary | ICD-10-CM

## 2023-07-14 ENCOUNTER — Inpatient Hospital Stay: Payer: 59 | Attending: Hematology & Oncology

## 2023-07-14 ENCOUNTER — Inpatient Hospital Stay: Payer: 59 | Admitting: Medical Oncology

## 2023-07-14 ENCOUNTER — Encounter: Payer: Self-pay | Admitting: Medical Oncology

## 2023-07-14 VITALS — BP 133/85 | HR 76 | Temp 97.7°F | Resp 17 | Wt 166.1 lb

## 2023-07-14 DIAGNOSIS — Z882 Allergy status to sulfonamides status: Secondary | ICD-10-CM | POA: Diagnosis not present

## 2023-07-14 DIAGNOSIS — D509 Iron deficiency anemia, unspecified: Secondary | ICD-10-CM | POA: Diagnosis not present

## 2023-07-14 DIAGNOSIS — Z888 Allergy status to other drugs, medicaments and biological substances status: Secondary | ICD-10-CM | POA: Diagnosis not present

## 2023-07-14 DIAGNOSIS — K922 Gastrointestinal hemorrhage, unspecified: Secondary | ICD-10-CM | POA: Insufficient documentation

## 2023-07-14 DIAGNOSIS — E860 Dehydration: Secondary | ICD-10-CM | POA: Insufficient documentation

## 2023-07-14 DIAGNOSIS — K449 Diaphragmatic hernia without obstruction or gangrene: Secondary | ICD-10-CM | POA: Diagnosis not present

## 2023-07-14 DIAGNOSIS — D500 Iron deficiency anemia secondary to blood loss (chronic): Secondary | ICD-10-CM | POA: Insufficient documentation

## 2023-07-14 DIAGNOSIS — Z881 Allergy status to other antibiotic agents status: Secondary | ICD-10-CM | POA: Diagnosis not present

## 2023-07-14 DIAGNOSIS — Z79899 Other long term (current) drug therapy: Secondary | ICD-10-CM | POA: Diagnosis not present

## 2023-07-14 LAB — CBC WITH DIFFERENTIAL (CANCER CENTER ONLY)
Abs Immature Granulocytes: 0.06 10*3/uL (ref 0.00–0.07)
Basophils Absolute: 0.1 10*3/uL (ref 0.0–0.1)
Basophils Relative: 1 %
Eosinophils Absolute: 0.1 10*3/uL (ref 0.0–0.5)
Eosinophils Relative: 1 %
HCT: 46.3 % — ABNORMAL HIGH (ref 36.0–46.0)
Hemoglobin: 15.4 g/dL — ABNORMAL HIGH (ref 12.0–15.0)
Immature Granulocytes: 1 %
Lymphocytes Relative: 32 %
Lymphs Abs: 1.9 10*3/uL (ref 0.7–4.0)
MCH: 31.1 pg (ref 26.0–34.0)
MCHC: 33.3 g/dL (ref 30.0–36.0)
MCV: 93.5 fL (ref 80.0–100.0)
Monocytes Absolute: 0.5 10*3/uL (ref 0.1–1.0)
Monocytes Relative: 8 %
Neutro Abs: 3.4 10*3/uL (ref 1.7–7.7)
Neutrophils Relative %: 57 %
Platelet Count: 302 10*3/uL (ref 150–400)
RBC: 4.95 MIL/uL (ref 3.87–5.11)
RDW: 12 % (ref 11.5–15.5)
WBC Count: 6 10*3/uL (ref 4.0–10.5)
nRBC: 0 % (ref 0.0–0.2)

## 2023-07-14 LAB — IRON AND IRON BINDING CAPACITY (CC-WL,HP ONLY)
Iron: 115 ug/dL (ref 28–170)
Saturation Ratios: 33 % — ABNORMAL HIGH (ref 10.4–31.8)
TIBC: 347 ug/dL (ref 250–450)
UIBC: 232 ug/dL (ref 148–442)

## 2023-07-14 LAB — RETICULOCYTES
Immature Retic Fract: 8 % (ref 2.3–15.9)
RBC.: 4.88 MIL/uL (ref 3.87–5.11)
Retic Count, Absolute: 64.9 10*3/uL (ref 19.0–186.0)
Retic Ct Pct: 1.3 % (ref 0.4–3.1)

## 2023-07-14 LAB — FERRITIN: Ferritin: 147 ng/mL (ref 11–307)

## 2023-07-14 NOTE — Progress Notes (Signed)
Hematology and Oncology Follow Up Visit  Jasmine Wells [id number] April 01, 1961 62 y.o. 07/14/2023   Principle Diagnosis:  Iron deficiency anemia - GI bleed/Hiatal Hernia   Prior work Up: Colonoscopy- [facility]- ~2014   Current Therapy:        IV iron as indicated- Venofer- last dose was on 04/12/2023   Interim History:  Jasmine Wells is here today for follow-up.   She reports that she is doing ok but her energy level is down again. Initially improved after her infusion. She tolerated the IV iron well.   Since her last visit she has been seen by gastroenterology. She has discussed an upcoming endoscopy which is scheduled for the 30th of this month. Soon thereafter she will be scheduled for the [facility] repair.  She has been trying to increase her iron intake through food. She tried for a balanced diet as well.   There has been no bleeding to her knowledge: denies epistaxis, gingivitis, hemoptysis, hematemesis, hematuria, melena, excessive bruising, blood donation.   No falls or syncope. She has had no other episodes since being home.  No fever, chills, n/v, cough, rash, dizziness, SOB, chest pain, palpitations, abdominal pain or changes in bowel or bladder habits at this time.  No swelling, tenderness, numbness or tingling in her extremities.  Appetite and hydration are good. Wt Readings from Last 3 Encounters:  07/14/23 166 lb 1.3 oz (75.3 kg)  03/18/23 166 lb (75.3 kg)  12/17/22 167 lb 12.8 oz (76.1 kg)   ECOG Performance Status: 1 - Symptomatic but completely ambulatory  Medications:  Allergies as of 07/14/2023       Reactions   Erythromycin    unsure   Prochlorperazine Other (See Comments)   shaking shaking   Promethazine Hcl    shaking   Reglan [metoclopramide]    "shaking"   Sulfa Antibiotics Hives        Medication List        Accurate as of July 14, 2023 10:58 AM. If you have any questions, ask your nurse or doctor.          albuterol 108 (90 Base)  MCG/ACT inhaler Commonly known as: VENTOLIN HFA TAKE 2 PUFFS BY MOUTH EVERY 6 HOURS AS NEEDED FOR WHEEZE OR SHORTNESS OF BREATH   busPIRone 7.5 MG tablet Commonly known as: BUSPAR TAKE 1 TABLET (7.5 MG TOTAL) BY MOUTH 2 (TWO) TIMES DAILY AS NEEDED (ANXIETY/PANIC).   famotidine 20 MG tablet Commonly known as: PEPCID Take 20 mg by mouth daily.   metoprolol succinate 25 MG 24 hr tablet Commonly known as: TOPROL-XL Take 12.5 mg by mouth daily.   venlafaxine XR 150 MG 24 hr capsule Commonly known as: EFFEXOR-XR Take 1 capsule (150 mg total) by mouth daily.        Allergies:  Allergies  Allergen Reactions   Erythromycin     unsure   Prochlorperazine Other (See Comments)    shaking shaking    Promethazine Hcl     shaking   Reglan [Metoclopramide]     "shaking"   Sulfa Antibiotics Hives    Past Medical History, Surgical history, Social history, and Family History were reviewed and updated.  Review of Systems: All other 10 point review of systems is negative.   Physical Exam:  weight is 166 lb 1.3 oz (75.3 kg). Her oral temperature is 97.7 F (36.5 C). Her blood pressure is 133/85 and her pulse is 76. Her respiration is 17 and oxygen saturation is 99%.  Wt Readings from Last 3 Encounters:  07/14/23 166 lb 1.3 oz (75.3 kg)  03/18/23 166 lb (75.3 kg)  12/17/22 167 lb 12.8 oz (76.1 kg)    Ocular: Sclerae unicteric, pupils equal, round and reactive to light Ear-nose-throat: Oropharynx clear, dentition fair Lymphatic: No cervical or supraclavicular adenopathy Lungs no rales or rhonchi, good excursion bilaterally Heart regular rate and rhythm, no murmur appreciated Abd soft, nontender, positive bowel sounds MSK no focal spinal tenderness, no joint edema Neuro: non-focal, well-oriented, appropriate affect  Lab Results  Component Value Date   WBC 6.0 07/14/2023   HGB 15.4 (H) 07/14/2023   HCT 46.3 (H) 07/14/2023   MCV 93.5 07/14/2023   PLT 302 07/14/2023   Lab  Results  Component Value Date   FERRITIN 10 (L) 03/18/2023   IRON 120 03/18/2023   TIBC 490 (H) 03/18/2023   UIBC 370 03/18/2023   IRONPCTSAT 25 03/18/2023   Lab Results  Component Value Date   RETICCTPCT 1.3 07/14/2023   RBC 4.88 07/14/2023   No results found for: "KPAFRELGTCHN", "LAMBDASER", "KAPLAMBRATIO" No results found for: "IGGSERUM", "IGA", "IGMSERUM" No results found for: "TOTALPROTELP", "ALBUMINELP", "A1GS", "A2GS", "BETS", "BETA2SER", "GAMS", "MSPIKE", "SPEI"   Chemistry      Component Value Date/Time   NA 142 12/17/2022 1333   K 3.9 12/17/2022 1333   CL 106 12/17/2022 1333   CO2 26 12/17/2022 1333   BUN 14 12/17/2022 1333   CREATININE 1.01 (H) 12/17/2022 1333      Component Value Date/Time   CALCIUM 10.0 12/17/2022 1333   ALKPHOS 104 12/17/2022 1333   AST 25 12/17/2022 1333   ALT 20 12/17/2022 1333   BILITOT 0.4 12/17/2022 1333     Encounter Diagnosis  Name Primary?   Iron deficiency anemia, unspecified iron deficiency anemia type Yes    Impression and Plan: Jasmine Wells is a very pleasant 62 yo caucasian female with recent diagnosis of iron deficiency anemia thought to be GI in origin.   Iron studies are pending. Hgb and HCT slightly high but she reports that she is a bit dehydrated. She will work on oral hydration of water intake. We will replace iron if needed.   RTC 4 months APP, labs   Rushie Chestnut, PA-C 10/10/202410:58 AM

## 2023-07-18 ENCOUNTER — Ambulatory Visit: Payer: 59 | Admitting: Family

## 2023-07-18 ENCOUNTER — Other Ambulatory Visit: Payer: 59

## 2023-11-15 ENCOUNTER — Other Ambulatory Visit: Payer: Self-pay

## 2023-11-15 ENCOUNTER — Inpatient Hospital Stay: Payer: 59 | Admitting: Medical Oncology

## 2023-11-15 ENCOUNTER — Encounter: Payer: Self-pay | Admitting: Medical Oncology

## 2023-11-15 ENCOUNTER — Inpatient Hospital Stay: Payer: 59 | Attending: Hematology & Oncology

## 2023-11-15 VITALS — BP 150/85 | HR 78 | Temp 97.8°F | Resp 18 | Ht 64.57 in | Wt 161.1 lb

## 2023-11-15 DIAGNOSIS — D509 Iron deficiency anemia, unspecified: Secondary | ICD-10-CM | POA: Diagnosis not present

## 2023-11-15 DIAGNOSIS — Z881 Allergy status to other antibiotic agents status: Secondary | ICD-10-CM | POA: Diagnosis not present

## 2023-11-15 DIAGNOSIS — K449 Diaphragmatic hernia without obstruction or gangrene: Secondary | ICD-10-CM | POA: Insufficient documentation

## 2023-11-15 DIAGNOSIS — Z882 Allergy status to sulfonamides status: Secondary | ICD-10-CM | POA: Diagnosis not present

## 2023-11-15 DIAGNOSIS — Z79899 Other long term (current) drug therapy: Secondary | ICD-10-CM | POA: Insufficient documentation

## 2023-11-15 DIAGNOSIS — K922 Gastrointestinal hemorrhage, unspecified: Secondary | ICD-10-CM | POA: Insufficient documentation

## 2023-11-15 DIAGNOSIS — D500 Iron deficiency anemia secondary to blood loss (chronic): Secondary | ICD-10-CM | POA: Diagnosis present

## 2023-11-15 DIAGNOSIS — Z888 Allergy status to other drugs, medicaments and biological substances status: Secondary | ICD-10-CM | POA: Diagnosis not present

## 2023-11-15 LAB — CBC WITH DIFFERENTIAL (CANCER CENTER ONLY)
Abs Immature Granulocytes: 0.05 10*3/uL (ref 0.00–0.07)
Basophils Absolute: 0.1 10*3/uL (ref 0.0–0.1)
Basophils Relative: 1 %
Eosinophils Absolute: 0.3 10*3/uL (ref 0.0–0.5)
Eosinophils Relative: 4 %
HCT: 44.3 % (ref 36.0–46.0)
Hemoglobin: 15 g/dL (ref 12.0–15.0)
Immature Granulocytes: 1 %
Lymphocytes Relative: 35 %
Lymphs Abs: 2.3 10*3/uL (ref 0.7–4.0)
MCH: 31.6 pg (ref 26.0–34.0)
MCHC: 33.9 g/dL (ref 30.0–36.0)
MCV: 93.5 fL (ref 80.0–100.0)
Monocytes Absolute: 0.6 10*3/uL (ref 0.1–1.0)
Monocytes Relative: 9 %
Neutro Abs: 3.3 10*3/uL (ref 1.7–7.7)
Neutrophils Relative %: 50 %
Platelet Count: 317 10*3/uL (ref 150–400)
RBC: 4.74 MIL/uL (ref 3.87–5.11)
RDW: 12.3 % (ref 11.5–15.5)
WBC Count: 6.6 10*3/uL (ref 4.0–10.5)
nRBC: 0 % (ref 0.0–0.2)

## 2023-11-15 LAB — IRON AND IRON BINDING CAPACITY (CC-WL,HP ONLY)
Iron: 101 ug/dL (ref 28–170)
Saturation Ratios: 29 % (ref 10.4–31.8)
TIBC: 346 ug/dL (ref 250–450)
UIBC: 245 ug/dL (ref 148–442)

## 2023-11-15 LAB — RETICULOCYTES
Immature Retic Fract: 7.4 % (ref 2.3–15.9)
RBC.: 4.73 MIL/uL (ref 3.87–5.11)
Retic Count, Absolute: 69.1 10*3/uL (ref 19.0–186.0)
Retic Ct Pct: 1.5 % (ref 0.4–3.1)

## 2023-11-15 LAB — FERRITIN: Ferritin: 213 ng/mL (ref 11–307)

## 2023-11-15 NOTE — Progress Notes (Signed)
Hematology and Oncology Follow Up Visit  Jasmine Wells [id number] Jan 09, 1961 63 y.o. 11/15/2023   Principle Diagnosis:  Iron deficiency anemia - GI bleed/Hiatal Hernia   Prior work Up: Colonoscopy- [facility]- ~2014   Current Therapy:        IV iron as indicated- Venofer- last dose was on 04/12/2023   Interim History:  Jasmine Wells is here today for follow-up.   Today she is here with her husband. They report that she is doing well. She is a bit stressed at the moment as her feeding tube from her hiatal hernia surgery accidentally "popped out" this morning. No pain, bleeding, etc. Surgeon is to call them back this morning about next steps.   She continues to feel greatly improved after her surgery and correction of her iron. She tolerated the IV iron well.   She has been trying to increase her iron intake through food. She tried for a balanced diet as well.   There has been no bleeding to her knowledge: denies epistaxis, gingivitis, hemoptysis, hematemesis, hematuria, melena, excessive bruising, blood donation.   No falls or syncope. She has had no other episodes since being home.  No fever, chills, n/v, cough, rash, dizziness, SOB, chest pain, palpitations, abdominal pain or changes in bowel or bladder habits at this time.  No swelling, tenderness, numbness or tingling in her extremities.  Appetite and hydration are good. Wt Readings from Last 3 Encounters:  11/15/23 161 lb 1.3 oz (73.1 kg)  07/14/23 166 lb 1.3 oz (75.3 kg)  03/18/23 166 lb (75.3 kg)   ECOG Performance Status: 1 - Symptomatic but completely ambulatory  Medications:  Allergies as of 11/15/2023       Reactions   Sulfa Antibiotics Hives   Erythromycin    unsure   Prochlorperazine Other (See Comments)   shaking   Promethazine Hcl    shaking   Reglan [metoclopramide]    "shaking"        Medication List        Accurate as of November 15, 2023 11:28 AM. If you have any questions, ask your nurse or doctor.           STOP taking these medications    famotidine 20 MG tablet Commonly known as: PEPCID Stopped by: Rushie Chestnut       TAKE these medications    Oyster Shell Calcium w/D 500-5 MG-MCG Tabs Take 1 tablet by mouth daily.   venlafaxine XR 150 MG 24 hr capsule Commonly known as: EFFEXOR-XR Take 1 capsule (150 mg total) by mouth daily.        Allergies:  Allergies  Allergen Reactions   Sulfa Antibiotics Hives   Erythromycin     unsure   Prochlorperazine Other (See Comments)    shaking     Promethazine Hcl     shaking   Reglan [Metoclopramide]     "shaking"    Past Medical History, Surgical history, Social history, and Family History were reviewed and updated.  Review of Systems: All other 10 point review of systems is negative.   Physical Exam:  height is 5' 4.57" (1.64 m) and weight is 161 lb 1.3 oz (73.1 kg). Her oral temperature is 97.8 F (36.6 C). Her blood pressure is 150/85 (abnormal) and her pulse is 78. Her respiration is 18 and oxygen saturation is 98%.   Wt Readings from Last 3 Encounters:  11/15/23 161 lb 1.3 oz (73.1 kg)  07/14/23 166 lb 1.3 oz (75.3 kg)  03/18/23 166 lb (75.3 kg)    Ocular: Sclerae unicteric, pupils equal, round and reactive to light Ear-nose-throat: Oropharynx clear, dentition fair Lymphatic: No cervical or supraclavicular adenopathy Lungs no rales or rhonchi, good excursion bilaterally Heart regular rate and rhythm, no murmur appreciated Abd soft, nontender, positive bowel sounds MSK no focal spinal tenderness, no joint edema Neuro: non-focal, well-oriented, appropriate affect  Lab Results  Component Value Date   WBC 6.6 11/15/2023   HGB 15.0 11/15/2023   HCT 44.3 11/15/2023   MCV 93.5 11/15/2023   PLT 317 11/15/2023   Lab Results  Component Value Date   FERRITIN 147 07/14/2023   IRON 115 07/14/2023   TIBC 347 07/14/2023   UIBC 232 07/14/2023   IRONPCTSAT 33 (H) 07/14/2023   Lab Results  Component  Value Date   RETICCTPCT 1.5 11/15/2023   RBC 4.73 11/15/2023   RBC 4.74 11/15/2023   No results found for: "KPAFRELGTCHN", "LAMBDASER", "KAPLAMBRATIO" No results found for: "IGGSERUM", "IGA", "IGMSERUM" No results found for: "TOTALPROTELP", "ALBUMINELP", "A1GS", "A2GS", "BETS", "BETA2SER", "GAMS", "MSPIKE", "SPEI"   Chemistry      Component Value Date/Time   NA 142 12/17/2022 1333   K 3.9 12/17/2022 1333   CL 106 12/17/2022 1333   CO2 26 12/17/2022 1333   BUN 14 12/17/2022 1333   CREATININE 1.01 (H) 12/17/2022 1333      Component Value Date/Time   CALCIUM 10.0 12/17/2022 1333   ALKPHOS 104 12/17/2022 1333   AST 25 12/17/2022 1333   ALT 20 12/17/2022 1333   BILITOT 0.4 12/17/2022 1333     Encounter Diagnosis  Name Primary?   Iron deficiency anemia, unspecified iron deficiency anemia type Yes   Impression and Plan: Jasmine Wells is a very pleasant 63 yo caucasian female with recent diagnosis of iron deficiency anemia thought to be GI in origin.    CBC today shows a normal Hgb of 15, MCV is 93.5. Retic panel is normal  Hopeful that the [facility] repair will help her IDA.  Iron studies are pending. Will replace if needed.   RTC 4 months APP, labs (CBC, iron, ferritin, retic)  Rushie Chestnut, PA-C 2/11/202511:28 AM

## 2024-03-13 ENCOUNTER — Other Ambulatory Visit: Payer: Self-pay | Admitting: Medical Oncology

## 2024-03-13 DIAGNOSIS — D509 Iron deficiency anemia, unspecified: Secondary | ICD-10-CM

## 2024-03-14 ENCOUNTER — Inpatient Hospital Stay: Payer: 59 | Admitting: Medical Oncology

## 2024-03-14 ENCOUNTER — Encounter: Payer: Self-pay | Admitting: Medical Oncology

## 2024-03-14 ENCOUNTER — Inpatient Hospital Stay: Payer: 59 | Attending: Medical Oncology

## 2024-03-14 VITALS — BP 126/82 | HR 75 | Temp 98.2°F | Resp 18 | Ht 64.0 in | Wt 155.1 lb

## 2024-03-14 DIAGNOSIS — Z888 Allergy status to other drugs, medicaments and biological substances status: Secondary | ICD-10-CM | POA: Diagnosis not present

## 2024-03-14 DIAGNOSIS — Z79899 Other long term (current) drug therapy: Secondary | ICD-10-CM | POA: Insufficient documentation

## 2024-03-14 DIAGNOSIS — Z881 Allergy status to other antibiotic agents status: Secondary | ICD-10-CM | POA: Insufficient documentation

## 2024-03-14 DIAGNOSIS — K922 Gastrointestinal hemorrhage, unspecified: Secondary | ICD-10-CM | POA: Diagnosis not present

## 2024-03-14 DIAGNOSIS — D509 Iron deficiency anemia, unspecified: Secondary | ICD-10-CM | POA: Diagnosis not present

## 2024-03-14 DIAGNOSIS — D500 Iron deficiency anemia secondary to blood loss (chronic): Secondary | ICD-10-CM | POA: Diagnosis present

## 2024-03-14 DIAGNOSIS — Z882 Allergy status to sulfonamides status: Secondary | ICD-10-CM | POA: Insufficient documentation

## 2024-03-14 DIAGNOSIS — K449 Diaphragmatic hernia without obstruction or gangrene: Secondary | ICD-10-CM | POA: Diagnosis not present

## 2024-03-14 LAB — CBC
HCT: 42 % (ref 36.0–46.0)
Hemoglobin: 14.4 g/dL (ref 12.0–15.0)
MCH: 32 pg (ref 26.0–34.0)
MCHC: 34.3 g/dL (ref 30.0–36.0)
MCV: 93.3 fL (ref 80.0–100.0)
Platelets: 316 10*3/uL (ref 150–400)
RBC: 4.5 MIL/uL (ref 3.87–5.11)
RDW: 11.9 % (ref 11.5–15.5)
WBC: 6.5 10*3/uL (ref 4.0–10.5)
nRBC: 0 % (ref 0.0–0.2)

## 2024-03-14 LAB — IRON AND IRON BINDING CAPACITY (CC-WL,HP ONLY)
Iron: 119 ug/dL (ref 28–170)
Saturation Ratios: 35 % — ABNORMAL HIGH (ref 10.4–31.8)
TIBC: 342 ug/dL (ref 250–450)
UIBC: 223 ug/dL (ref 148–442)

## 2024-03-14 LAB — RETIC PANEL
Immature Retic Fract: 7.5 % (ref 2.3–15.9)
RBC.: 4.55 MIL/uL (ref 3.87–5.11)
Retic Count, Absolute: 57.8 10*3/uL (ref 19.0–186.0)
Retic Ct Pct: 1.3 % (ref 0.4–3.1)
Reticulocyte Hemoglobin: 35.6 pg (ref >27.9)

## 2024-03-14 LAB — FERRITIN: Ferritin: 188 ng/mL (ref 11–307)

## 2024-03-14 NOTE — Progress Notes (Signed)
 Hematology and Oncology Follow Up Visit  Jasmine Wells [id number] [birth year] 63 y.o. 03/14/2024   Principle Diagnosis:  Iron  deficiency anemia - GI bleed/Hiatal Hernia   Prior work Up: Colonoscopy- [facility]- ~2014   Current Therapy:        IV iron  as indicated- Venofer - last dose was on 04/12/2023   Interim History:  Jasmine Wells is here today for follow-up.   Today she states that she is doing well. She has no concerns.   She has been trying to increase her iron  intake through food. She tried for a balanced diet as well. She is also s/p 1 round of IV iron  which she tolerated well.   There has been no bleeding to her knowledge: denies epistaxis, gingivitis, hemoptysis, hematemesis, hematuria, melena, excessive bruising, blood donation.   No falls or syncope. She has had no other episodes since being home.  No fever, chills, n/v, cough, rash, dizziness, SOB, chest pain, palpitations, abdominal pain or changes in bowel or bladder habits at this time.  No swelling, tenderness, numbness or tingling in her extremities.  Appetite and hydration are good. Wt Readings from Last 3 Encounters:  03/14/24 155 lb 1.9 oz (70.4 kg)  11/15/23 161 lb 1.3 oz (73.1 kg)  07/14/23 166 lb 1.3 oz (75.3 kg)   ECOG Performance Status: 1 - Symptomatic but completely ambulatory  Medications:  Allergies as of 03/14/2024       Reactions   Prochlorperazine Other (See Comments)   shaking   Promethazine Hcl Other (See Comments)   shaking   Reglan [metoclopramide] Other (See Comments)   shaking   Sulfa Antibiotics Hives   Erythromycin Other (See Comments)   unsure        Medication List        Accurate as of March 14, 2024 10:37 AM. If you have any questions, ask your nurse or doctor.          Oyster Shell Calcium w/D 500-5 MG-MCG Tabs Take 1 tablet by mouth daily.   venlafaxine  XR 150 MG 24 hr capsule Commonly known as: EFFEXOR -XR Take 1 capsule (150 mg total) by mouth daily.         Allergies:  Allergies  Allergen Reactions   Prochlorperazine Other (See Comments)    shaking     Promethazine Hcl Other (See Comments)    shaking   Reglan [Metoclopramide] Other (See Comments)    shaking   Sulfa Antibiotics Hives   Erythromycin Other (See Comments)    unsure    Past Medical History, Surgical history, Social history, and Family History were reviewed and updated.  Review of Systems: All other 10 point review of systems is negative.   Physical Exam:  height is 5' 4 (1.626 m) and weight is 155 lb 1.9 oz (70.4 kg). Her oral temperature is 98.2 F (36.8 C). Her blood pressure is 126/82 and her pulse is 75. Her respiration is 18 and oxygen saturation is 100%.   Wt Readings from Last 3 Encounters:  03/14/24 155 lb 1.9 oz (70.4 kg)  11/15/23 161 lb 1.3 oz (73.1 kg)  07/14/23 166 lb 1.3 oz (75.3 kg)    Ocular: Sclerae unicteric, pupils equal, round and reactive to light Ear-nose-throat: Oropharynx clear, dentition fair Lymphatic: No cervical or supraclavicular adenopathy Lungs no rales or rhonchi, good excursion bilaterally Heart regular rate and rhythm, no murmur appreciated Abd soft, nontender, positive bowel sounds MSK no focal spinal tenderness, no joint edema Neuro: non-focal, well-oriented, appropriate affect  Lab Results  Component Value Date   WBC 6.5 03/14/2024   HGB 14.4 03/14/2024   HCT 42.0 03/14/2024   MCV 93.3 03/14/2024   PLT 316 03/14/2024   Lab Results  Component Value Date   FERRITIN 213 11/15/2023   IRON  101 11/15/2023   TIBC 346 11/15/2023   UIBC 245 11/15/2023   IRONPCTSAT 29 11/15/2023   Lab Results  Component Value Date   RETICCTPCT 1.3 03/14/2024   RBC 4.55 03/14/2024   No results found for: KPAFRELGTCHN, LAMBDASER, KAPLAMBRATIO No results found for: IGGSERUM, IGA, IGMSERUM No results found for: Hobson Luna, SPEI   Chemistry       Component Value Date/Time   NA 142 12/17/2022 1333   K 3.9 12/17/2022 1333   CL 106 12/17/2022 1333   CO2 26 12/17/2022 1333   BUN 14 12/17/2022 1333   CREATININE 1.01 (H) 12/17/2022 1333      Component Value Date/Time   CALCIUM 10.0 12/17/2022 1333   ALKPHOS 104 12/17/2022 1333   AST 25 12/17/2022 1333   ALT 20 12/17/2022 1333   BILITOT 0.4 12/17/2022 1333     Encounter Diagnosis  Name Primary?   Iron  deficiency anemia, unspecified iron  deficiency anemia type Yes    Impression and Plan: Jasmine Wells is a very pleasant 63 yo caucasian female with recent diagnosis of iron  deficiency anemia thought to be GI in origin.    CBC today shows a normal Hgb of 14.4 MCV is 93.3.  Retic panel is normal  Continue GI follow up Iron  studies are pending. Will replace if needed.   She would like to follow up PRN now that source of bleeding as been resolved.   Sharla Davis, PA-C 6/11/202510:37 AM

## 2024-03-15 ENCOUNTER — Ambulatory Visit: Payer: Self-pay | Admitting: Medical Oncology

## 2024-05-28 DIAGNOSIS — Z01419 Encounter for gynecological examination (general) (routine) without abnormal findings: Secondary | ICD-10-CM | POA: Diagnosis not present

## 2024-05-28 DIAGNOSIS — Z6828 Body mass index (BMI) 28.0-28.9, adult: Secondary | ICD-10-CM | POA: Diagnosis not present

## 2024-05-28 DIAGNOSIS — Z1231 Encounter for screening mammogram for malignant neoplasm of breast: Secondary | ICD-10-CM | POA: Diagnosis not present

## 2024-06-05 ENCOUNTER — Encounter: Payer: Self-pay | Admitting: Family

## 2024-06-11 DIAGNOSIS — Z13228 Encounter for screening for other metabolic disorders: Secondary | ICD-10-CM | POA: Diagnosis not present

## 2024-06-11 DIAGNOSIS — Z13220 Encounter for screening for lipoid disorders: Secondary | ICD-10-CM | POA: Diagnosis not present

## 2024-06-11 DIAGNOSIS — Z8639 Personal history of other endocrine, nutritional and metabolic disease: Secondary | ICD-10-CM | POA: Diagnosis not present

## 2024-06-11 DIAGNOSIS — Z131 Encounter for screening for diabetes mellitus: Secondary | ICD-10-CM | POA: Diagnosis not present

## 2024-06-11 NOTE — Progress Notes (Unsigned)
 LILLETTE Ileana Collet, PhD, LAT, ATC acting as a scribe for Artist Lloyd, MD.  Jasmine Wells is a 63 y.o. female who presents to [facility] at [facility] today for osteoporosis management. She notes hx hiatial hernia that was reparied in Dec 2024  DEXA scan (date, T-score): 05/04/23: Spine= -4.2, L-FN= -2.8, R-FN= -2.8 Prior treatment: none History of Hip, Spine, or Wrist Fx: none Heart disease or stroke: no Cancer: no Kidney Disease: no Gastric/Peptic Ulcer: no Gastric bypass surgery: no Severe GERD: no-- has resolved Hx of seizures: no Age at Menopause: 63y/o Calcium intake: yes Vitamin D  intake: yes Hormone replacement therapy: no Smoking history: never smoked Alcohol : almost none Exercise: seldom-- walking Major dental work in past year: no Parents with hip/spine fracture: none Height loss: yes: 63 to 63.5   Pertinent review of systems: No fevers or chills  Relevant historical information: Hypertension and osteoporosis.  Iron  deficiency anemia.  History of hiatal hernia status post surgery much improved.   Exam:  BP 122/84   Pulse 77   Ht 5' 3.5 (1.613 m)   Wt 160 lb (72.6 kg)   BMI 27.90 kg/m  General: Well Developed, well nourished, and in no acute distress.   MSK: Normal cervical and lumbar motion.      Assessment and Plan: 63 y.o. female with severe osteoporosis.  Her T-score is -4.2 in the lumbar spine and in the osteoporosis range in both hips.  Fortunately she does not have a history of fragility fracture but is at very high risk for fracture with her very low T-score.  We spent time talking about options.  Stressed the importance of weightbearing exercise and resistance training.  Recommend continuing vitamin D  and the calcium that she still is taking.  Labs were obtained at her OB/GYN office yesterday but I do not have access to those labs at this time.  We spent a lot of time talking about medication options.  Her sister has  osteoporosis and has had a poor experience with oral bisphosphonates and what sounds like infusion bisphosphonates or Prolia.  Based on her personal history of significant GI upset and a hiatal hernia I do think that oral bisphosphonates are a bad idea.  After lengthy discussion of her medication options we will start using anabolic agent such as Tymlos  or  teriparatide for 1 year and anticipate switching to Reclast after 1 year.  This will of course depend on cost of this medication after we get it approved.  For now we will work on authorization and get records of recent labs from OB/GYN.  She will report back in about a month with how things are going.  Recommend [facility]   PDMP not reviewed this encounter. No orders of the defined types were placed in this encounter.  Meds ordered this encounter  Medications   Abaloparatide  (TYMLOS ) 3120 MCG/1.56ML SOPN    Sig: Inject 80 mcg into the skin daily.    Dispense:  1.57 mL    Refill:  11   sharps container    Sig: 1 each by Does not apply route as needed. Use daily with Tymlos     Dispense:  1 each    Refill:  3   Insulin Pen Needle (PEN NEEDLES) 31G X 5 MM MISC    Sig: 1 each by Does not apply route as needed. Use daily with Tymlos     Dispense:  100 each  Refill:  3   Alcohol  Swabs (WEBCOL ALCOHOL  PREP MEDIUM) 70 % PADS    Sig: 1 each by Does not apply route as needed. Use daily with Tymlos     Dispense:  200 each    Refill:  2     Discussed warning signs or symptoms. Please see discharge instructions. Patient expresses understanding.   The above documentation has been reviewed and is accurate and complete Artist Lloyd, M.D.

## 2024-06-12 ENCOUNTER — Ambulatory Visit (INDEPENDENT_AMBULATORY_CARE_PROVIDER_SITE_OTHER): Admitting: Family Medicine

## 2024-06-12 ENCOUNTER — Telehealth: Payer: Self-pay

## 2024-06-12 ENCOUNTER — Encounter: Payer: Self-pay | Admitting: Family

## 2024-06-12 VITALS — BP 122/84 | HR 77 | Ht 63.5 in | Wt 160.0 lb

## 2024-06-12 DIAGNOSIS — M810 Age-related osteoporosis without current pathological fracture: Secondary | ICD-10-CM | POA: Diagnosis not present

## 2024-06-12 LAB — LAB REPORT - SCANNED: EGFR: 86

## 2024-06-12 LAB — HEMOGLOBIN A1C: A1c: 5.5

## 2024-06-12 MED ORDER — PEN NEEDLES 31G X 5 MM MISC: 1.0000 | 3 refills | Status: AC | PRN

## 2024-06-12 MED ORDER — TYMLOS 3120 MCG/1.56ML ~~LOC~~ SOPN: 80.0000 ug | PEN_INJECTOR | Freq: Every day | SUBCUTANEOUS | 11 refills | Status: AC

## 2024-06-12 MED ORDER — WEBCOL ALCOHOL PREP MEDIUM 70 % PADS: 1.0000 | MEDICATED_PAD | 2 refills | Status: AC | PRN

## 2024-06-12 MED ORDER — SHARPS CONTAINER MISC: 1.0000 | 3 refills | Status: AC | PRN

## 2024-06-12 NOTE — Telephone Encounter (Signed)
 Please check for coverage of Tymlos 

## 2024-06-12 NOTE — Patient Instructions (Addendum)
 Thank you for coming in today.   We will work to authorize Tymlos   We will request lab results from your OBGYN next week  Consider working out with a personal trainer  Reclast infusion  Tymlos   [place]

## 2024-06-13 NOTE — Telephone Encounter (Signed)
 Prior Authorization initiated for TYMLOS  via [email or web address] KEY: [id number]   Outcome Additional Information Required Prior Authorization Not Required

## 2024-06-19 ENCOUNTER — Encounter: Payer: Self-pay | Admitting: Family Medicine

## 2024-06-22 NOTE — Telephone Encounter (Signed)
 We should probably just do the Reclast infusion

## 2024-06-22 NOTE — Telephone Encounter (Addendum)
 Called and spoke with patient. She states that Tymlos  is going to be about $1,000 per month which is not affordable for her.   Will check formulary and see what alternative Dr. Joane recommends.

## 2024-06-27 ENCOUNTER — Encounter: Payer: Self-pay | Admitting: Family Medicine

## 2024-06-27 ENCOUNTER — Telehealth: Payer: Self-pay | Admitting: Pharmacy Technician

## 2024-06-27 NOTE — Addendum Note (Signed)
 Addended by: MARDY LEOTIS RAMAN on: 06/27/2024 02:58 PM   Modules accepted: Orders

## 2024-06-27 NOTE — Telephone Encounter (Signed)
 Auth Submission: NO AUTH NEEDED Site of care: Site of care: [facility] Payer: [employer] Medication & CPT/J Code(s) submitted: Reclast (Zolendronic acid) [id number] Diagnosis Code: M81.0 Route of submission (phone, fax, portal):  Phone # Fax # Auth type: Buy/Bill PB Units/visits requested: X1 DOSE Reference number:  Approval from: 06/27/24 to 10/03/24

## 2024-06-27 NOTE — Telephone Encounter (Signed)
 Called and spoke with patient, she is agreeable to Reclast Infusion.   Order placed to [facility].

## 2024-06-28 ENCOUNTER — Encounter: Payer: Self-pay | Admitting: Obstetrics and Gynecology

## 2024-07-09 ENCOUNTER — Encounter: Payer: Self-pay | Admitting: Family Medicine

## 2024-07-11 NOTE — Telephone Encounter (Signed)
 Raguel Iha, CPhT    06/27/24  3:06 PM Note Auth Submission: NO AUTH NEEDED Site of care: Site of care: [facility] Payer: [employer] Medication & CPT/J Code(s) submitted: Reclast (Zolendronic acid) [id number] Diagnosis Code: M81.0 Route of submission (phone, fax, portal):  Phone # Fax # Auth type: Buy/Bill PB Units/visits requested: X1 DOSE Reference number:  Approval from: 06/27/24 to 10/03/24

## 2024-09-07 NOTE — Telephone Encounter (Signed)
 Error
